# Patient Record
Sex: Male | Born: 1970 | Race: White | Hispanic: No | Smoking: Current every day smoker
Health system: Southern US, Community
[De-identification: ages and names within clinical notes are randomized; demographics above are authoritative.]

## PROBLEM LIST (undated history)

## (undated) DIAGNOSIS — N200 Calculus of kidney: Secondary | ICD-10-CM

## (undated) DIAGNOSIS — M719 Bursopathy, unspecified: Secondary | ICD-10-CM

## (undated) DIAGNOSIS — F419 Anxiety disorder, unspecified: Secondary | ICD-10-CM

## (undated) DIAGNOSIS — M779 Enthesopathy, unspecified: Secondary | ICD-10-CM

## (undated) DIAGNOSIS — M549 Dorsalgia, unspecified: Secondary | ICD-10-CM

## (undated) DIAGNOSIS — IMO0001 Reserved for inherently not codable concepts without codable children: Secondary | ICD-10-CM

## (undated) DIAGNOSIS — M75100 Unspecified rotator cuff tear or rupture of unspecified shoulder, not specified as traumatic: Secondary | ICD-10-CM

## (undated) DIAGNOSIS — F4310 Post-traumatic stress disorder, unspecified: Secondary | ICD-10-CM

## (undated) DIAGNOSIS — G8929 Other chronic pain: Secondary | ICD-10-CM

## (undated) HISTORY — DX: Post-traumatic stress disorder, unspecified: F43.10

## (undated) HISTORY — DX: Unspecified rotator cuff tear or rupture of unspecified shoulder, not specified as traumatic: M75.100

## (undated) HISTORY — PX: KIDNEY STONE SURGERY: SHX686

---

## 2008-02-26 ENCOUNTER — Emergency Department (HOSPITAL_COMMUNITY): Admission: EM | Admit: 2008-02-26 | Discharge: 2008-02-26 | Payer: Self-pay | Admitting: Emergency Medicine

## 2008-06-16 ENCOUNTER — Emergency Department (HOSPITAL_COMMUNITY): Admission: EM | Admit: 2008-06-16 | Discharge: 2008-06-16 | Payer: Self-pay | Admitting: Emergency Medicine

## 2009-04-18 ENCOUNTER — Emergency Department (HOSPITAL_COMMUNITY): Admission: EM | Admit: 2009-04-18 | Discharge: 2009-04-18 | Payer: Self-pay | Admitting: Emergency Medicine

## 2009-05-17 ENCOUNTER — Emergency Department (HOSPITAL_COMMUNITY): Admission: EM | Admit: 2009-05-17 | Discharge: 2009-05-17 | Payer: Self-pay | Admitting: Emergency Medicine

## 2009-08-08 ENCOUNTER — Emergency Department (HOSPITAL_COMMUNITY): Admission: EM | Admit: 2009-08-08 | Discharge: 2009-08-08 | Payer: Self-pay | Admitting: Emergency Medicine

## 2009-10-12 ENCOUNTER — Ambulatory Visit (HOSPITAL_COMMUNITY): Admission: RE | Admit: 2009-10-12 | Discharge: 2009-10-12 | Payer: Self-pay | Admitting: Urology

## 2010-01-16 ENCOUNTER — Inpatient Hospital Stay (HOSPITAL_COMMUNITY): Admission: EM | Admit: 2010-01-16 | Discharge: 2010-01-20 | Payer: Self-pay | Admitting: Emergency Medicine

## 2010-01-28 ENCOUNTER — Ambulatory Visit (HOSPITAL_COMMUNITY): Admission: RE | Admit: 2010-01-28 | Discharge: 2010-01-28 | Payer: Self-pay | Admitting: Urology

## 2010-02-17 ENCOUNTER — Ambulatory Visit (HOSPITAL_COMMUNITY): Admission: RE | Admit: 2010-02-17 | Discharge: 2010-02-17 | Payer: Self-pay | Admitting: Urology

## 2010-02-19 ENCOUNTER — Encounter (HOSPITAL_COMMUNITY): Admission: RE | Admit: 2010-02-19 | Discharge: 2010-03-21 | Payer: Self-pay | Admitting: Orthopaedic Surgery

## 2010-02-24 ENCOUNTER — Ambulatory Visit (HOSPITAL_COMMUNITY): Admission: RE | Admit: 2010-02-24 | Discharge: 2010-02-24 | Payer: Self-pay | Admitting: Urology

## 2010-03-09 ENCOUNTER — Ambulatory Visit (HOSPITAL_COMMUNITY): Admission: RE | Admit: 2010-03-09 | Discharge: 2010-03-09 | Payer: Self-pay | Admitting: Urology

## 2010-05-20 ENCOUNTER — Encounter: Payer: Self-pay | Admitting: Orthopedic Surgery

## 2010-05-20 ENCOUNTER — Emergency Department (HOSPITAL_COMMUNITY): Admission: EM | Admit: 2010-05-20 | Discharge: 2010-05-20 | Payer: Self-pay | Admitting: Emergency Medicine

## 2010-05-23 ENCOUNTER — Inpatient Hospital Stay (HOSPITAL_COMMUNITY): Admission: EM | Admit: 2010-05-23 | Discharge: 2010-05-26 | Payer: Self-pay | Admitting: Emergency Medicine

## 2010-05-23 ENCOUNTER — Ambulatory Visit: Payer: Self-pay | Admitting: Orthopedic Surgery

## 2010-05-25 ENCOUNTER — Encounter: Payer: Self-pay | Admitting: Orthopedic Surgery

## 2010-05-26 ENCOUNTER — Encounter: Payer: Self-pay | Admitting: Orthopedic Surgery

## 2010-06-01 ENCOUNTER — Ambulatory Visit: Payer: Self-pay | Admitting: Orthopedic Surgery

## 2010-06-01 ENCOUNTER — Telehealth: Payer: Self-pay | Admitting: Orthopedic Surgery

## 2010-06-01 DIAGNOSIS — IMO0002 Reserved for concepts with insufficient information to code with codable children: Secondary | ICD-10-CM | POA: Insufficient documentation

## 2010-06-03 ENCOUNTER — Ambulatory Visit (HOSPITAL_COMMUNITY): Admission: RE | Admit: 2010-06-03 | Payer: Self-pay | Admitting: Urology

## 2010-06-14 ENCOUNTER — Telehealth: Payer: Self-pay | Admitting: Orthopedic Surgery

## 2010-06-14 ENCOUNTER — Encounter: Payer: Self-pay | Admitting: Orthopedic Surgery

## 2010-06-15 ENCOUNTER — Encounter: Payer: Self-pay | Admitting: Orthopedic Surgery

## 2010-06-30 ENCOUNTER — Ambulatory Visit: Payer: Self-pay | Admitting: Orthopedic Surgery

## 2010-06-30 ENCOUNTER — Encounter (INDEPENDENT_AMBULATORY_CARE_PROVIDER_SITE_OTHER): Payer: Self-pay | Admitting: *Deleted

## 2010-08-14 ENCOUNTER — Encounter: Payer: Self-pay | Admitting: Urology

## 2010-08-24 NOTE — Miscellaneous (Signed)
Summary: Discharge from [facility]  Discharge from [facility]   Imported By: Jacklynn Ganong 06/14/2010 13:07:32  _____________________________________________________________________  External Attachment:    Type:   Image     Comment:   External Document

## 2010-08-24 NOTE — Progress Notes (Signed)
Summary: Call completed [facility]  ---- Converted from flag ---- ---- 06/01/2010 11:57 AM, Fuller Canada MD wrote: Okey Regal [facility]   Let me speak with the Pharmacist to ok his refills ------------------------------

## 2010-08-24 NOTE — Assessment & Plan Note (Signed)
Summary: HOSP FOL/UP/RE-CK LT THUMB/?XRAY/PER DR HARRISON/[place] MEDICAID[place]   Visit Type:  Follow-up  CC:  cellulitis of left arm.  History of Present Illness: DX: Abscess left thumb.  Treatment: I & D, IV antibiotics, followed by by mouth antibiotics.  MEDS: Clindamycin and hydrocodone   Complaints: He says he is still having pain especially if he gets prearranged.    Today, scheduled for: recheck the wound has closed up for the most part. He still has some swelling and redness around the LEFT thumb. The streaking cellulitis has resolved.  He did loosen this medication when he fell out of his pocket. We will call the  pharmacist to get it refilled.  We will see him again in a month for a final checkup barring any problems     Impression & Recommendations:  Problem # 1:  FELON (ICD-681.01) Assessment Improved  Orders: Post-Op Check (43329)  Patient Instructions: 1)  No further dressing changes needed apply band aids  2)  1 month recheck    Orders Added: 1)  Post-Op Check [51884]

## 2010-08-24 NOTE — Progress Notes (Signed)
Summary: Using 2 diff dr's and 2 diff pharmacies for Narcotics  Phone Note From Pharmacy Call back at [facility] pharmacy   Summary of Call: called stating that patient is getting Hydorcodone 7.5 from another Dr and another pharmacy, last filled 06/11/10 and was trying to get pain med from [place] filled today, advised no Initial call taken by: Ether Griffins,  June 14, 2010 3:16 PM

## 2010-08-24 NOTE — Letter (Signed)
Summary: Hospital prog note  Hospital prog note   Imported By: Cammie Sickle 05/28/2010 12:16:44  _____________________________________________________________________  External Attachment:    Type:   Image     Comment:   External Document

## 2010-08-24 NOTE — Letter (Signed)
Summary: *Orthopedic No Show Letter  [facility]  [address] [place]  [place], [place] [address]   Phone: [phone]  Fax: [phone]    06/30/2010     [place] [address] [place] [address] [place], [place] [address]       Dear Mr. Ambrosini,   Our records indicate that you missed your scheduled appointment with Dr. Beaulah Corin on 06-30-2010. Please contact this office to reschedule your appointment as soon as possible.  It is important that you keep your scheduled appointments with your physician, so we can provide you the best care possible.        Sincerely,   Dr. Terrance Mass, MD [facility] Phone [phone]

## 2010-08-24 NOTE — Miscellaneous (Signed)
Summary: [facility] plan of care  [facility] plan of care   Imported By: Jacklynn Ganong 06/16/2010 09:07:01  _____________________________________________________________________  External Attachment:    Type:   Image     Comment:   External Document

## 2010-08-24 NOTE — Letter (Signed)
Summary: Patient discharge instructions  Patient discharge instructions   Imported By: Jacklynn Ganong 06/02/2010 11:54:46  _____________________________________________________________________  External Attachment:    Type:   Image     Comment:   External Document

## 2010-10-05 LAB — VANCOMYCIN, TROUGH: Vancomycin Tr: 11.8 ug/mL (ref 10.0–20.0)

## 2010-10-06 LAB — DIFFERENTIAL
Lymphocytes Relative: 15 % (ref 12–46)
Lymphs Abs: 2.7 10*3/uL (ref 0.7–4.0)
Monocytes Relative: 5 % (ref 3–12)
Neutro Abs: 14.9 10*3/uL — ABNORMAL HIGH (ref 1.7–7.7)
Neutrophils Relative %: 80 % — ABNORMAL HIGH (ref 43–77)

## 2010-10-06 LAB — WOUND CULTURE

## 2010-10-06 LAB — MRSA PCR SCREENING: MRSA by PCR: NEGATIVE

## 2010-10-06 LAB — CBC
Hemoglobin: 14.1 g/dL (ref 13.0–17.0)
MCH: 28.7 pg (ref 26.0–34.0)
RBC: 4.91 MIL/uL (ref 4.22–5.81)
WBC: 18.6 10*3/uL — ABNORMAL HIGH (ref 4.0–10.5)

## 2010-10-06 LAB — COMPREHENSIVE METABOLIC PANEL
Albumin: 3.4 g/dL — ABNORMAL LOW (ref 3.5–5.2)
BUN: 12 mg/dL (ref 6–23)
Creatinine, Ser: 1.12 mg/dL (ref 0.4–1.5)
Glucose, Bld: 94 mg/dL (ref 70–99)
Total Protein: 6.8 g/dL (ref 6.0–8.3)

## 2010-10-08 LAB — SURGICAL PCR SCREEN: Staphylococcus aureus: POSITIVE — AB

## 2010-10-10 LAB — DIFFERENTIAL
Basophils Absolute: 0 10*3/uL (ref 0.0–0.1)
Basophils Absolute: 0 10*3/uL (ref 0.0–0.1)
Basophils Absolute: 0.1 10*3/uL (ref 0.0–0.1)
Basophils Absolute: 0.1 10*3/uL (ref 0.0–0.1)
Basophils Relative: 0 % (ref 0–1)
Basophils Relative: 1 % (ref 0–1)
Basophils Relative: 1 % (ref 0–1)
Eosinophils Absolute: 0.2 10*3/uL (ref 0.0–0.7)
Eosinophils Absolute: 0.4 10*3/uL (ref 0.0–0.7)
Eosinophils Absolute: 0.5 10*3/uL (ref 0.0–0.7)
Eosinophils Relative: 2 % (ref 0–5)
Eosinophils Relative: 3 % (ref 0–5)
Lymphocytes Relative: 24 % (ref 12–46)
Lymphocytes Relative: 8 % — ABNORMAL LOW (ref 12–46)
Lymphs Abs: 3.2 10*3/uL (ref 0.7–4.0)
Monocytes Absolute: 0.8 10*3/uL (ref 0.1–1.0)
Monocytes Absolute: 0.8 10*3/uL (ref 0.1–1.0)
Monocytes Absolute: 1.1 10*3/uL — ABNORMAL HIGH (ref 0.1–1.0)
Monocytes Absolute: 1.5 10*3/uL — ABNORMAL HIGH (ref 0.1–1.0)
Monocytes Relative: 6 % (ref 3–12)
Monocytes Relative: 7 % (ref 3–12)
Monocytes Relative: 8 % (ref 3–12)
Monocytes Relative: 8 % (ref 3–12)
Neutro Abs: 10 10*3/uL — ABNORMAL HIGH (ref 1.7–7.7)
Neutro Abs: 17.7 10*3/uL — ABNORMAL HIGH (ref 1.7–7.7)
Neutro Abs: 8.6 10*3/uL — ABNORMAL HIGH (ref 1.7–7.7)
Neutrophils Relative %: 57 % (ref 43–77)
Neutrophils Relative %: 65 % (ref 43–77)
Neutrophils Relative %: 70 % (ref 43–77)

## 2010-10-10 LAB — BASIC METABOLIC PANEL
BUN: 20 mg/dL (ref 6–23)
BUN: 21 mg/dL (ref 6–23)
BUN: 23 mg/dL (ref 6–23)
CO2: 26 mEq/L (ref 19–32)
CO2: 26 mEq/L (ref 19–32)
Calcium: 8.5 mg/dL (ref 8.4–10.5)
Calcium: 9 mg/dL (ref 8.4–10.5)
Chloride: 105 mEq/L (ref 96–112)
Chloride: 105 mEq/L (ref 96–112)
Creatinine, Ser: 2.94 mg/dL — ABNORMAL HIGH (ref 0.4–1.5)
GFR calc Af Amer: 42 mL/min — ABNORMAL LOW (ref >60)
GFR calc non Af Amer: 24 mL/min — ABNORMAL LOW (ref >60)
GFR calc non Af Amer: 25 mL/min — ABNORMAL LOW (ref >60)
GFR calc non Af Amer: 28 mL/min — ABNORMAL LOW (ref >60)
Glucose, Bld: 109 mg/dL — ABNORMAL HIGH (ref 70–99)
Glucose, Bld: 93 mg/dL (ref 70–99)
Glucose, Bld: 98 mg/dL (ref 70–99)
Glucose, Bld: 98 mg/dL (ref 70–99)
Potassium: 4 mEq/L (ref 3.5–5.1)
Potassium: 4 mEq/L (ref 3.5–5.1)
Potassium: 4 mEq/L (ref 3.5–5.1)
Sodium: 136 mEq/L (ref 135–145)
Sodium: 137 mEq/L (ref 135–145)
Sodium: 138 mEq/L (ref 135–145)
Sodium: 138 mEq/L (ref 135–145)
Sodium: 139 mEq/L (ref 135–145)

## 2010-10-10 LAB — URINALYSIS, ROUTINE W REFLEX MICROSCOPIC
Bilirubin Urine: NEGATIVE
Nitrite: NEGATIVE
Specific Gravity, Urine: 1.025 (ref 1.005–1.030)
pH: 6 (ref 5.0–8.0)

## 2010-10-10 LAB — CBC
HCT: 36.7 % — ABNORMAL LOW (ref 39.0–52.0)
HCT: 37.1 % — ABNORMAL LOW (ref 39.0–52.0)
HCT: 39.6 % (ref 39.0–52.0)
Hemoglobin: 12.5 g/dL — ABNORMAL LOW (ref 13.0–17.0)
Hemoglobin: 12.5 g/dL — ABNORMAL LOW (ref 13.0–17.0)
Hemoglobin: 12.9 g/dL — ABNORMAL LOW (ref 13.0–17.0)
Hemoglobin: 12.9 g/dL — ABNORMAL LOW (ref 13.0–17.0)
Hemoglobin: 13.8 g/dL (ref 13.0–17.0)
MCH: 28.3 pg (ref 26.0–34.0)
MCH: 28.5 pg (ref 26.0–34.0)
MCHC: 33.7 g/dL (ref 30.0–36.0)
MCHC: 34.4 g/dL (ref 30.0–36.0)
MCHC: 34.5 g/dL (ref 30.0–36.0)
MCV: 83.8 fL (ref 78.0–100.0)
MCV: 84.2 fL (ref 78.0–100.0)
Platelets: 207 10*3/uL (ref 150–400)
RBC: 4.41 MIL/uL (ref 4.22–5.81)
RBC: 4.76 MIL/uL (ref 4.22–5.81)
RDW: 13.6 % (ref 11.5–15.5)
RDW: 14.1 % (ref 11.5–15.5)
RDW: 14.2 % (ref 11.5–15.5)
WBC: 13.1 10*3/uL — ABNORMAL HIGH (ref 4.0–10.5)
WBC: 21 10*3/uL — ABNORMAL HIGH (ref 4.0–10.5)

## 2010-10-10 LAB — URINE CULTURE
Colony Count: NO GROWTH
Culture: NO GROWTH
Special Requests: NEGATIVE

## 2010-10-10 LAB — CULTURE, BLOOD (ROUTINE X 2)
Report Status: 6302011
Report Status: 6302011

## 2010-10-10 LAB — RENAL FUNCTION PANEL
Albumin: 3 g/dL — ABNORMAL LOW (ref 3.5–5.2)
Calcium: 8 mg/dL — ABNORMAL LOW (ref 8.4–10.5)
GFR calc Af Amer: 30 mL/min — ABNORMAL LOW (ref >60)
Phosphorus: 3.5 mg/dL (ref 2.3–4.6)
Potassium: 3.8 mEq/L (ref 3.5–5.1)
Sodium: 138 mEq/L (ref 135–145)

## 2010-10-10 LAB — URINE MICROSCOPIC-ADD ON

## 2010-10-10 LAB — RAPID URINE DRUG SCREEN, HOSP PERFORMED
Barbiturates: NOT DETECTED
Opiates: POSITIVE — AB

## 2010-12-17 ENCOUNTER — Emergency Department (HOSPITAL_COMMUNITY)
Admission: EM | Admit: 2010-12-17 | Discharge: 2010-12-17 | Disposition: A | Discharge status: Home / Self Care | Payer: Medicaid Other | Attending: Emergency Medicine | Admitting: Emergency Medicine

## 2010-12-17 DIAGNOSIS — F319 Bipolar disorder, unspecified: Secondary | ICD-10-CM | POA: Insufficient documentation

## 2010-12-17 DIAGNOSIS — Z79899 Other long term (current) drug therapy: Secondary | ICD-10-CM | POA: Insufficient documentation

## 2010-12-17 DIAGNOSIS — L0211 Cutaneous abscess of neck: Secondary | ICD-10-CM | POA: Insufficient documentation

## 2010-12-17 DIAGNOSIS — L03319 Cellulitis of trunk, unspecified: Secondary | ICD-10-CM | POA: Insufficient documentation

## 2010-12-17 DIAGNOSIS — M129 Arthropathy, unspecified: Secondary | ICD-10-CM | POA: Insufficient documentation

## 2010-12-17 DIAGNOSIS — L02219 Cutaneous abscess of trunk, unspecified: Secondary | ICD-10-CM | POA: Insufficient documentation

## 2011-04-27 ENCOUNTER — Emergency Department (HOSPITAL_COMMUNITY)
Admission: EM | Admit: 2011-04-27 | Discharge: 2011-04-27 | Disposition: A | Discharge status: Home / Self Care | Payer: Medicaid Other | Attending: Emergency Medicine | Admitting: Emergency Medicine

## 2011-04-27 DIAGNOSIS — R112 Nausea with vomiting, unspecified: Secondary | ICD-10-CM | POA: Insufficient documentation

## 2011-04-27 DIAGNOSIS — R109 Unspecified abdominal pain: Secondary | ICD-10-CM | POA: Insufficient documentation

## 2011-04-27 DIAGNOSIS — N200 Calculus of kidney: Secondary | ICD-10-CM | POA: Insufficient documentation

## 2011-04-27 DIAGNOSIS — Z79899 Other long term (current) drug therapy: Secondary | ICD-10-CM | POA: Insufficient documentation

## 2011-04-27 HISTORY — DX: Bursopathy, unspecified: M71.9

## 2011-04-27 HISTORY — DX: Anxiety disorder, unspecified: F41.9

## 2011-04-27 HISTORY — DX: Enthesopathy, unspecified: M77.9

## 2011-04-27 HISTORY — DX: Calculus of kidney: N20.0

## 2011-04-27 HISTORY — DX: Reserved for inherently not codable concepts without codable children: IMO0001

## 2011-04-27 LAB — BASIC METABOLIC PANEL
BUN: 16 mg/dL (ref 6–23)
Chloride: 97 mEq/L (ref 96–112)
Glucose, Bld: 91 mg/dL (ref 70–99)
Potassium: 4 mEq/L (ref 3.5–5.1)

## 2011-04-27 LAB — URINALYSIS, ROUTINE W REFLEX MICROSCOPIC
Bilirubin Urine: NEGATIVE
Specific Gravity, Urine: >1.03 — ABNORMAL HIGH (ref 1.005–1.030)
pH: 5.5 (ref 5.0–8.0)

## 2011-04-27 LAB — URINE MICROSCOPIC-ADD ON

## 2011-04-27 MED ORDER — HYDROMORPHONE HCL 1 MG/ML IJ SOLN
1.0000 mg | Freq: Once | INTRAMUSCULAR | Status: AC
  Administered 2011-04-27: 1 mg via INTRAVENOUS
  Filled 2011-04-27: qty 1

## 2011-04-27 MED ORDER — PROMETHAZINE HCL 25 MG PO TABS: 25.0000 mg | ORAL_TABLET | Freq: Four times a day (QID) | ORAL | Status: AC | PRN

## 2011-04-27 MED ORDER — OXYCODONE-ACETAMINOPHEN 5-325 MG PO TABS
2.0000 | ORAL_TABLET | Freq: Once | ORAL | Status: AC
  Administered 2011-04-27: 2 via ORAL
  Filled 2011-04-27: qty 2

## 2011-04-27 MED ORDER — OXYCODONE-ACETAMINOPHEN 5-325 MG PO TABS: 1.0000 | ORAL_TABLET | ORAL | Status: AC | PRN

## 2011-04-27 MED ORDER — KETOROLAC TROMETHAMINE 30 MG/ML IJ SOLN
30.0000 mg | Freq: Once | INTRAMUSCULAR | Status: AC
Start: 2011-04-27 — End: 2011-04-27
  Administered 2011-04-27: 30 mg via INTRAVENOUS
  Filled 2011-04-27: qty 1

## 2011-04-27 MED ORDER — CIPROFLOXACIN IN D5W 400 MG/200ML IV SOLN
400.0000 mg | Freq: Once | INTRAVENOUS | Status: AC
  Administered 2011-04-27: 400 mg via INTRAVENOUS
  Filled 2011-04-27: qty 200

## 2011-04-27 MED ORDER — CIPROFLOXACIN HCL 500 MG PO TABS: 500.0000 mg | ORAL_TABLET | Freq: Two times a day (BID) | ORAL | Status: AC

## 2011-04-27 MED ORDER — TAMSULOSIN HCL 0.4 MG PO CAPS: 0.4000 mg | ORAL_CAPSULE | Freq: Two times a day (BID) | ORAL | Status: DC

## 2011-04-27 MED ORDER — ONDANSETRON HCL 4 MG/2ML IJ SOLN
4.0000 mg | Freq: Once | INTRAMUSCULAR | Status: AC
  Administered 2011-04-27: 4 mg via INTRAVENOUS
  Filled 2011-04-27: qty 2

## 2011-04-27 MED ORDER — NAPROXEN 500 MG PO TABS: 500.0000 mg | ORAL_TABLET | Freq: Two times a day (BID) | ORAL | Status: AC

## 2011-04-27 NOTE — ED Notes (Signed)
Right flank pain that started tonight

## 2011-04-27 NOTE — ED Provider Notes (Signed)
History     CSN: [id number] Arrival date & time: 04/27/2011  2:48 AM  Chief Complaint  Patient presents with  . Flank Pain    (Consider location/radiation/quality/duration/timing/severity/associated sxs/prior treatment) HPI Comments: Patient is a very healthy 39 -year-old male with a history of kidney stones intermittently in the past who presents with acute onset of right flank pain several hours ago. This is sharp in nature and radiates to his right groin. Symptoms are constant, waxing and waning in intensity and are associated with nausea and vomiting. At this time the symptoms are moderate. He took medication prior to arrival with no improvement in his symptoms. He does note that he has had to have a urinary stent placed in the past  Patient is a 40 y.o. male presenting with flank pain. The history is provided by the patient, medical records and the spouse.  Flank Pain Associated symptoms include abdominal pain. Pertinent negatives include no chest pain, no headaches and no shortness of breath.    Past Medical History  Diagnosis Date  . Kidney stones   . Anxiety   . Blood poisoning   . Bipolar disorder   . Bursitis   . Tendonitis     Past Surgical History  Procedure Date  . Kidney stone surgery     No family history on file.  History  Substance Use Topics  . Smoking status: Current Everyday Smoker    Types: Cigarettes  . Smokeless tobacco: Not on file  . Alcohol Use: No      Review of Systems  Constitutional: Negative for fever and chills.  HENT: Negative for sore throat and neck pain.   Eyes: Negative for visual disturbance.  Respiratory: Negative for cough and shortness of breath.   Cardiovascular: Negative for chest pain.  Gastrointestinal: Positive for nausea, vomiting and abdominal pain. Negative for diarrhea.  Genitourinary: Positive for flank pain. Negative for dysuria and frequency.  Musculoskeletal: Negative for back pain.  Skin: Negative for rash.    Neurological: Negative for weakness, numbness and headaches.  Hematological: Negative for adenopathy.  Psychiatric/Behavioral: Negative for behavioral problems.    Allergies  Review of patient's allergies indicates no known allergies.  Home Medications   Current Outpatient Rx  Name Route Sig Dispense Refill  . ALPRAZOLAM 0.25 MG PO TABS Oral Take 0.25 mg by mouth.      . FLUOXETINE HCL 10 MG PO CAPS Oral Take by mouth daily.      Marland Kitchen HYDROCODONE-ACETAMINOPHEN 7.5-500 MG PO TABS Oral Take 1 tablet by mouth every 4 (four) hours as needed.      Marland Kitchen LAMOTRIGINE 100 MG PO TABS Oral Take 100 mg by mouth daily.      . TRAZODONE HCL 100 MG PO TABS Oral Take 100 mg by mouth at bedtime.        BP 141/102  Pulse 77  Temp(Src) 97.9 F (36.6 C) (Oral)  Resp 22  Ht 5\' 10"  (1.778 m)  Wt 180 lb (81.647 kg)  BMI 25.83 kg/m2  SpO2 100%  Physical Exam  Nursing note and vitals reviewed. Constitutional: He appears well-developed and well-nourished. No distress.  HENT:  Head: Normocephalic and atraumatic.  Mouth/Throat: Oropharynx is clear and moist. No oropharyngeal exudate.  Eyes: Conjunctivae and EOM are normal. Pupils are equal, round, and reactive to light. Right eye exhibits no discharge. Left eye exhibits no discharge. No scleral icterus.  Neck: Normal range of motion. Neck supple. No JVD present. No thyromegaly present.  Cardiovascular: Normal rate,  regular rhythm, normal heart sounds and intact distal pulses.  Exam reveals no gallop and no friction rub.   No murmur heard. Pulmonary/Chest: Effort normal and breath sounds normal. No respiratory distress. He has no wheezes. He has no rales.  Abdominal: Soft. Bowel sounds are normal. He exhibits no distension and no mass. There is no tenderness.       Right-sided CVA tenderness. No abdominal tenderness to palpation  Musculoskeletal: Normal range of motion. He exhibits no edema and no tenderness.  Lymphadenopathy:    He has no cervical  adenopathy.  Neurological: He is alert. Coordination normal.  Skin: Skin is warm and dry. No rash noted. No erythema.  Psychiatric: He has a normal mood and affect. His behavior is normal.    ED Course  Procedures (including critical care time)   Labs Reviewed  URINALYSIS, ROUTINE W REFLEX MICROSCOPIC  BASIC METABOLIC PANEL   No results found.   No diagnosis found.    MDM  Exam consistent with a kidney stone. At this time patient declined CT scan and appropriately requests medication including anti-inflammatories. IV medications ordered urinalysis and basic metabolic panel to evaluate for kidney function to    Patient has had significant improvement in his symptoms, has a urinalysis showing large hemoglobin, negative nitrate, negative leukocytes, many bacteria. There are no white blood cells present. There is calcium oxalate crystals present. I believe that the majority of his symptoms are related to a kidney stone about a urinary infection however due to the many bacteria I will treat with ciprofloxacin prior to discharge. Again the patient has declined CT at this time I so comfortable with the patient following up with his outpatient urologist. Maryclare Labrador start on Flomax, pain medications, nausea medicine and followup.  Renal function reviewed and is normal.  Results for orders placed during the hospital encounter of 04/27/11  URINALYSIS, ROUTINE W REFLEX MICROSCOPIC      Component Value Range   Color, Urine YELLOW  YELLOW    Appearance CLOUDY (*) CLEAR    Specific Gravity, Urine >1.030 (*) 1.005 - 1.030    pH 5.5  5.0 - 8.0    Glucose, UA NEGATIVE  NEGATIVE (mg/dL)   Hgb urine dipstick LARGE (*) NEGATIVE    Bilirubin Urine NEGATIVE  NEGATIVE    Ketones, ur NEGATIVE  NEGATIVE (mg/dL)   Protein, ur NEGATIVE  NEGATIVE (mg/dL)   Urobilinogen, UA 0.2  0.0 - 1.0 (mg/dL)   Nitrite NEGATIVE  NEGATIVE    Leukocytes, UA NEGATIVE  NEGATIVE   BASIC METABOLIC PANEL      Component Value  Range   Sodium 135  135 - 145 (mEq/L)   Potassium 4.0  3.5 - 5.1 (mEq/L)   Chloride 97  96 - 112 (mEq/L)   CO2 23  19 - 32 (mEq/L)   Glucose, Bld 91  70 - 99 (mg/dL)   BUN 16  6 - 23 (mg/dL)   Creatinine, Ser 6.96  0.50 - 1.35 (mg/dL)   Calcium 9.1  8.4 - 29.5 (mg/dL)   GFR calc non Af Amer 79 (*) >90 (mL/min)   GFR calc Af Amer >90  >90 (mL/min)  URINE MICROSCOPIC-ADD ON      Component Value Range   WBC, UA 0-2  <3 (WBC/hpf)   RBC / HPF TOO NUMEROUS TO COUNT  <3 (RBC/hpf)   Bacteria, UA MANY (*) RARE    Crystals CA OXALATE CRYSTALS (*) NEGATIVE    No results found.    Arlys John  Cody Amass, MD 04/27/11 [phone]

## 2011-04-29 ENCOUNTER — Other Ambulatory Visit (HOSPITAL_COMMUNITY): Payer: Self-pay | Admitting: Urology

## 2011-04-29 DIAGNOSIS — R109 Unspecified abdominal pain: Secondary | ICD-10-CM

## 2011-05-02 ENCOUNTER — Ambulatory Visit (HOSPITAL_COMMUNITY)
Admission: RE | Admit: 2011-05-02 | Discharge: 2011-05-02 | Disposition: A | Discharge status: Home / Self Care | Payer: Medicaid Other | Source: Ambulatory Visit | Attending: Urology | Admitting: Urology

## 2011-05-02 DIAGNOSIS — R3129 Other microscopic hematuria: Secondary | ICD-10-CM | POA: Insufficient documentation

## 2011-05-02 DIAGNOSIS — J984 Other disorders of lung: Secondary | ICD-10-CM | POA: Insufficient documentation

## 2011-05-02 DIAGNOSIS — R1031 Right lower quadrant pain: Secondary | ICD-10-CM | POA: Insufficient documentation

## 2011-05-02 DIAGNOSIS — R109 Unspecified abdominal pain: Secondary | ICD-10-CM

## 2011-05-02 DIAGNOSIS — N200 Calculus of kidney: Secondary | ICD-10-CM | POA: Insufficient documentation

## 2011-06-13 ENCOUNTER — Ambulatory Visit (HOSPITAL_COMMUNITY)
Admission: RE | Admit: 2011-06-13 | Discharge: 2011-06-13 | Disposition: A | Discharge status: Home / Self Care | Payer: Medicaid Other | Source: Ambulatory Visit | Attending: Urology | Admitting: Urology

## 2011-06-13 ENCOUNTER — Other Ambulatory Visit (HOSPITAL_COMMUNITY): Payer: Self-pay | Admitting: Urology

## 2011-06-13 DIAGNOSIS — N200 Calculus of kidney: Secondary | ICD-10-CM

## 2011-06-13 DIAGNOSIS — R109 Unspecified abdominal pain: Secondary | ICD-10-CM | POA: Insufficient documentation

## 2012-12-23 ENCOUNTER — Encounter (HOSPITAL_COMMUNITY): Payer: Self-pay | Admitting: Emergency Medicine

## 2012-12-23 ENCOUNTER — Emergency Department (HOSPITAL_COMMUNITY)
Admission: EM | Admit: 2012-12-23 | Discharge: 2012-12-23 | Disposition: A | Discharge status: Home / Self Care | Payer: Medicaid Other | Attending: Emergency Medicine | Admitting: Emergency Medicine

## 2012-12-23 DIAGNOSIS — M549 Dorsalgia, unspecified: Secondary | ICD-10-CM

## 2012-12-23 DIAGNOSIS — F319 Bipolar disorder, unspecified: Secondary | ICD-10-CM | POA: Insufficient documentation

## 2012-12-23 DIAGNOSIS — Z79899 Other long term (current) drug therapy: Secondary | ICD-10-CM | POA: Insufficient documentation

## 2012-12-23 DIAGNOSIS — Z8739 Personal history of other diseases of the musculoskeletal system and connective tissue: Secondary | ICD-10-CM | POA: Insufficient documentation

## 2012-12-23 DIAGNOSIS — Z87442 Personal history of urinary calculi: Secondary | ICD-10-CM | POA: Insufficient documentation

## 2012-12-23 DIAGNOSIS — F17200 Nicotine dependence, unspecified, uncomplicated: Secondary | ICD-10-CM | POA: Insufficient documentation

## 2012-12-23 DIAGNOSIS — M2550 Pain in unspecified joint: Secondary | ICD-10-CM | POA: Insufficient documentation

## 2012-12-23 DIAGNOSIS — F411 Generalized anxiety disorder: Secondary | ICD-10-CM | POA: Insufficient documentation

## 2012-12-23 DIAGNOSIS — G8929 Other chronic pain: Secondary | ICD-10-CM | POA: Insufficient documentation

## 2012-12-23 DIAGNOSIS — M545 Low back pain, unspecified: Secondary | ICD-10-CM | POA: Insufficient documentation

## 2012-12-23 HISTORY — DX: Dorsalgia, unspecified: M54.9

## 2012-12-23 HISTORY — DX: Other chronic pain: G89.29

## 2012-12-23 MED ORDER — ACETAMINOPHEN-CODEINE #3 300-30 MG PO TABS: ORAL_TABLET | ORAL | Status: DC

## 2012-12-23 MED ORDER — DICLOFENAC SODIUM 75 MG PO TBEC: 75.0000 mg | DELAYED_RELEASE_TABLET | Freq: Two times a day (BID) | ORAL | Status: DC

## 2012-12-23 NOTE — ED Notes (Signed)
States that he is having more pain in his lower back with spasms down his left leg.  States he does have a history of lower back problems chronically.

## 2012-12-23 NOTE — ED Provider Notes (Signed)
History     CSN: [id number]  Arrival date & time 12/23/12  1446   First MD Initiated Contact with Patient 12/23/12 1557      Chief Complaint  Patient presents with  . Back Pain    (Consider location/radiation/quality/duration/timing/severity/associated sxs/prior treatment) Patient is a 42 y.o. male presenting with back pain. The history is provided by the patient.  Back Pain Location:  Lumbar spine Quality:  Cramping and aching Radiates to:  L thigh Pain severity:  Severe Pain is:  Same all the time Onset quality:  Gradual Duration:  4 weeks Timing:  Intermittent Progression:  Worsening Chronicity:  Chronic Context comment:  Chronic back pain Relieved by:  Nothing Ineffective treatments:  Cold packs and heating pad Associated symptoms: no abdominal pain, no bladder incontinence, no bowel incontinence, no chest pain, no dysuria and no perianal numbness   Risk factors: no recent surgery     Past Medical History  Diagnosis Date  . Kidney stones   . Anxiety   . Blood poisoning   . Bipolar disorder   . Bursitis   . Tendonitis   . Chronic back pain     Past Surgical History  Procedure Laterality Date  . Kidney stone surgery      No family history on file.  History  Substance Use Topics  . Smoking status: Current Every Day Smoker    Types: Cigarettes  . Smokeless tobacco: Not on file  . Alcohol Use: No      Review of Systems  Constitutional: Negative for activity change.       All ROS Neg except as noted in HPI  HENT: Negative for nosebleeds and neck pain.   Eyes: Negative for photophobia and discharge.  Respiratory: Negative for cough, shortness of breath and wheezing.   Cardiovascular: Negative for chest pain and palpitations.  Gastrointestinal: Negative for abdominal pain, blood in stool and bowel incontinence.  Genitourinary: Negative for bladder incontinence, dysuria, frequency and hematuria.  Musculoskeletal: Positive for back pain and arthralgias.   Skin: Negative.   Neurological: Negative for dizziness, seizures and speech difficulty.  Psychiatric/Behavioral: Negative for hallucinations and confusion. The patient is nervous/anxious.     Allergies  Review of patient's allergies indicates no known allergies.  Home Medications   Current Outpatient Rx  Name  Route  Sig  Dispense  Refill  . ALPRAZolam (XANAX) 1 MG tablet   Oral   Take 1 mg by mouth 4 (four) times daily as needed for sleep or anxiety.         Marland Kitchen FLUoxetine (PROZAC) 10 MG capsule   Oral   Take 10 mg by mouth daily.          Marland Kitchen ibuprofen (ADVIL,MOTRIN) 200 MG tablet   Oral   Take 200 mg by mouth every 6 (six) hours as needed for pain.         Marland Kitchen lamoTRIgine (LAMICTAL) 100 MG tablet   Oral   Take 100 mg by mouth 2 (two) times daily.            BP 134/91  Pulse 90  Temp(Src) 98.2 F (36.8 C) (Oral)  Resp 16  Ht 5\' 10"  (1.778 m)  Wt 180 lb (81.647 kg)  BMI 25.83 kg/m2  SpO2 100%  Physical Exam  Nursing note and vitals reviewed. Constitutional: He is oriented to person, place, and time. He appears well-developed and well-nourished.  Non-toxic appearance.  HENT:  Head: Normocephalic.  Right Ear: Tympanic membrane and external ear  normal.  Left Ear: Tympanic membrane and external ear normal.  Eyes: EOM and lids are normal. Pupils are equal, round, and reactive to light.  Neck: Normal range of motion. Neck supple. Carotid bruit is not present.  Cardiovascular: Normal rate, regular rhythm, normal heart sounds, intact distal pulses and normal pulses.   Pulmonary/Chest: Breath sounds normal. No respiratory distress.  Abdominal: Soft. Bowel sounds are normal. There is no tenderness. There is no guarding.  Musculoskeletal: Normal range of motion.  Lower lumbar area pain. No hot areas. No step off.  Lymphadenopathy:       Head (right side): No submandibular adenopathy present.       Head (left side): No submandibular adenopathy present.    He has no  cervical adenopathy.  Neurological: He is alert and oriented to person, place, and time. He has normal strength. No cranial nerve deficit or sensory deficit. He exhibits normal muscle tone. Coordination normal.  Skin: Skin is warm and dry.  Psychiatric: He has a normal mood and affect. His speech is normal.    ED Course  Procedures (including critical care time)  Labs Reviewed - No data to display No results found.   No diagnosis found.    MDM  I have reviewed nursing notes, vital signs, and all appropriate lab and imaging results for this patient. Pt has long term hx of back pain. Pain has been lasting longer for the past 4 days, and extends t the left lower extremity.. No injury or trauma. No loss of bowel or bladder function. No gross neuro function on exam. Plan: diclofenac and norco for pain. Pt to follow up with primary MD or orthopedics for evaluation and management.       Kathie Dike, PA-C 12/23/12 1610

## 2012-12-23 NOTE — ED Notes (Signed)
Pt reports has had lower back pain for "years."  Pt c/o lower back pain radiating down left leg x 4 weeks.  Denies injury.  Denies any incontinence of bowel or bladder.

## 2012-12-24 NOTE — ED Provider Notes (Signed)
Medical screening examination/treatment/procedure(s) were performed by non-physician practitioner and as supervising physician I was immediately available for consultation/collaboration. Devoria Albe, MD, [facility]   Ward Givens, MD 12/24/12 [phone]

## 2013-01-05 ENCOUNTER — Encounter (HOSPITAL_COMMUNITY): Payer: Self-pay

## 2013-01-05 ENCOUNTER — Emergency Department (HOSPITAL_COMMUNITY)
Admission: EM | Admit: 2013-01-05 | Discharge: 2013-01-05 | Disposition: A | Discharge status: Home / Self Care | Payer: Medicaid Other | Attending: Emergency Medicine | Admitting: Emergency Medicine

## 2013-01-05 DIAGNOSIS — M545 Low back pain, unspecified: Secondary | ICD-10-CM

## 2013-01-05 DIAGNOSIS — F17200 Nicotine dependence, unspecified, uncomplicated: Secondary | ICD-10-CM | POA: Insufficient documentation

## 2013-01-05 DIAGNOSIS — J029 Acute pharyngitis, unspecified: Secondary | ICD-10-CM

## 2013-01-05 DIAGNOSIS — Z87442 Personal history of urinary calculi: Secondary | ICD-10-CM | POA: Insufficient documentation

## 2013-01-05 DIAGNOSIS — F319 Bipolar disorder, unspecified: Secondary | ICD-10-CM | POA: Insufficient documentation

## 2013-01-05 DIAGNOSIS — R1310 Dysphagia, unspecified: Secondary | ICD-10-CM | POA: Insufficient documentation

## 2013-01-05 DIAGNOSIS — Z8739 Personal history of other diseases of the musculoskeletal system and connective tissue: Secondary | ICD-10-CM | POA: Insufficient documentation

## 2013-01-05 DIAGNOSIS — F411 Generalized anxiety disorder: Secondary | ICD-10-CM | POA: Insufficient documentation

## 2013-01-05 DIAGNOSIS — Z8619 Personal history of other infectious and parasitic diseases: Secondary | ICD-10-CM | POA: Insufficient documentation

## 2013-01-05 DIAGNOSIS — R52 Pain, unspecified: Secondary | ICD-10-CM | POA: Insufficient documentation

## 2013-01-05 DIAGNOSIS — G8929 Other chronic pain: Secondary | ICD-10-CM | POA: Insufficient documentation

## 2013-01-05 DIAGNOSIS — K1379 Other lesions of oral mucosa: Secondary | ICD-10-CM

## 2013-01-05 DIAGNOSIS — Z79899 Other long term (current) drug therapy: Secondary | ICD-10-CM | POA: Insufficient documentation

## 2013-01-05 LAB — RAPID STREP SCREEN (MED CTR MEBANE ONLY): Streptococcus, Group A Screen (Direct): NEGATIVE

## 2013-01-05 MED ORDER — ONDANSETRON HCL 4 MG/2ML IJ SOLN
4.0000 mg | Freq: Once | INTRAMUSCULAR | Status: AC
  Administered 2013-01-05: 4 mg via INTRAVENOUS
  Filled 2013-01-05 (×2): qty 2

## 2013-01-05 MED ORDER — DEXAMETHASONE SODIUM PHOSPHATE 4 MG/ML IJ SOLN
10.0000 mg | Freq: Once | INTRAMUSCULAR | Status: AC
  Administered 2013-01-05: 10 mg via INTRAVENOUS
  Filled 2013-01-05: qty 3

## 2013-01-05 MED ORDER — DIPHENHYDRAMINE HCL 50 MG/ML IJ SOLN
25.0000 mg | Freq: Once | INTRAMUSCULAR | Status: AC
  Administered 2013-01-05: 25 mg via INTRAVENOUS
  Filled 2013-01-05: qty 1

## 2013-01-05 MED ORDER — OXYCODONE-ACETAMINOPHEN 5-325 MG PO TABS
1.0000 | ORAL_TABLET | Freq: Once | ORAL | Status: AC
  Administered 2013-01-05: 1 via ORAL
  Filled 2013-01-05: qty 1

## 2013-01-05 MED ORDER — PREDNISONE 50 MG PO TABS: ORAL_TABLET | ORAL | Status: DC

## 2013-01-05 MED ORDER — DIPHENHYDRAMINE HCL 25 MG PO TABS: 50.0000 mg | ORAL_TABLET | ORAL | Status: DC | PRN

## 2013-01-05 MED ORDER — FAMOTIDINE 20 MG PO TABS: 20.0000 mg | ORAL_TABLET | Freq: Two times a day (BID) | ORAL | Status: DC

## 2013-01-05 MED ORDER — HYDROMORPHONE HCL PF 1 MG/ML IJ SOLN
1.0000 mg | Freq: Once | INTRAMUSCULAR | Status: AC
  Administered 2013-01-05: 1 mg via INTRAVENOUS
  Filled 2013-01-05: qty 1

## 2013-01-05 MED ORDER — FAMOTIDINE IN NACL 20-0.9 MG/50ML-% IV SOLN
20.0000 mg | Freq: Once | INTRAVENOUS | Status: AC
  Administered 2013-01-05: 20 mg via INTRAVENOUS
  Filled 2013-01-05: qty 50

## 2013-01-05 MED ORDER — SODIUM CHLORIDE 0.9 % IV SOLN
1000.0000 mL | INTRAVENOUS | Status: DC
  Administered 2013-01-05: 1000 mL via INTRAVENOUS

## 2013-01-05 MED ORDER — SODIUM CHLORIDE 0.9 % IV SOLN
1000.0000 mL | Freq: Once | INTRAVENOUS | Status: AC
  Administered 2013-01-05: 1000 mL via INTRAVENOUS

## 2013-01-05 NOTE — ED Provider Notes (Signed)
Pt stable, no worsening, I feel he is safe for d/c home Discussed strict return precautions   Joya Gaskins, MD 01/05/13 [phone]

## 2013-01-05 NOTE — ED Notes (Signed)
Sore throat onset this am, states also has continued lower back pain for some time

## 2013-01-05 NOTE — ED Provider Notes (Signed)
I assumed care in signout to monitor patient's uvula edema He is awake/alert, no distress, no stridor, no tongue/lip swelling but does have uvula edema He is handling secretions well Will continue to monitor  Joya Gaskins, MD 01/05/13 0840

## 2013-01-05 NOTE — ED Provider Notes (Signed)
History     CSN: [id number]  Arrival date & time 01/05/13  0540   First MD Initiated Contact with Patient 01/05/13 [phone]      Chief Complaint  Patient presents with  . Back Pain  . Sore Throat    (Consider location/radiation/quality/duration/timing/severity/associated sxs/prior treatment) Patient is a 42 y.o. male presenting with back pain and pharyngitis. The history is provided by the patient.  Back Pain Sore Throat  He woke this morning with a sore throat and a feeling like his uvula was swollen. He states that it happened once before and he was told it was some kind of allergy. He has no difficulty with swallowing it is not having any difficulty breathing. He denies fever, chills, sweats. Nothing makes it better nothing makes it worse. He also has chronic back pain which flares up periodically and he states that his back pain has been worse over the last 2 weeks. He had been given a prescription for Tylenol with codeine but states that her has not helped and he has not taken anything else for his pain. He has been applying ice packs. He denies any weakness, numbness, tingling. He denies any bowel or bladder dysfunction. He denies any precipitating trauma.  Past Medical History  Diagnosis Date  . Kidney stones   . Anxiety   . Blood poisoning   . Bipolar disorder   . Bursitis   . Tendonitis   . Chronic back pain     Past Surgical History  Procedure Laterality Date  . Kidney stone surgery      Family History  Problem Relation Age of Onset  . Heart failure Other     History  Substance Use Topics  . Smoking status: Current Every Day Smoker -- 1.50 packs/day for 28 years    Types: Cigarettes  . Smokeless tobacco: Never Used  . Alcohol Use: No     Comment: occasional      Review of Systems  Musculoskeletal: Positive for back pain.  All other systems reviewed and are negative.    Allergies  Review of patient's allergies indicates no known allergies.  Home  Medications   Current Outpatient Rx  Name  Route  Sig  Dispense  Refill  . ALPRAZolam (XANAX) 1 MG tablet   Oral   Take 1 mg by mouth 4 (four) times daily as needed for sleep or anxiety.         Marland Kitchen FLUoxetine (PROZAC) 10 MG capsule   Oral   Take 10 mg by mouth daily.          Marland Kitchen ibuprofen (ADVIL,MOTRIN) 200 MG tablet   Oral   Take 200 mg by mouth every 6 (six) hours as needed for pain.         Marland Kitchen lamoTRIgine (LAMICTAL) 100 MG tablet   Oral   Take 100 mg by mouth 2 (two) times daily.          Marland Kitchen acetaminophen-codeine (TYLENOL #3) 300-30 MG per tablet      1 or 2 po q4h prn pain   20 tablet   0   . diclofenac (VOLTAREN) 75 MG EC tablet   Oral   Take 1 tablet (75 mg total) by mouth 2 (two) times daily.   12 tablet   0     Please take this medication with food     BP 142/104  Pulse 92  Temp(Src) 98.1 F (36.7 C) (Oral)  Resp 20  Ht 5\' 10"  (1.778 m)  Wt 180 lb (81.647 kg)  BMI 25.83 kg/m2  SpO2 99%  Physical Exam  Nursing note and vitals reviewed.  42 year old male, resting comfortably and in no acute distress. Vital signs are significant for hypertension with blood pressure 142/104. Oxygen saturation is 99%, which is normal. Head is normocephalic and atraumatic. PERRLA, EOMI. Oropharynx shows erythema and hypertrophy of tonsils and edema of the uvula. No exudate is seen. He's not having any difficulty with secretions, there is no stridor, and phonation is normal. Neck is nontender and supple without adenopathy or JVD. Back is nontender and there is no CVA tenderness. Straight leg raise is positive on the left at 30 and on the right at 45. Lungs are clear without rales, wheezes, or rhonchi. Chest is nontender. Heart has regular rate and rhythm without murmur. Abdomen is soft, flat, nontender without masses or hepatosplenomegaly and peristalsis is normoactive. Extremities have no cyanosis or edema, full range of motion is present. Skin is warm and dry without  rash. Neurologic: Mental status is normal, cranial nerves are intact, there are no motor or sensory deficits.  ED Course  Procedures (including critical care time)  Results for orders placed during the hospital encounter of 01/05/13  RAPID STREP SCREEN      Result Value Range   Streptococcus, Group A Screen (Direct) NEGATIVE  NEGATIVE    1. Pharyngitis   2. Uvular edema   3. Acute exacerbation of chronic low back pain       MDM  Pharyngitis with swelling of the uvula. This could be infectious or allergic in origin. It does not have appearance of angioedema. Strep screen will be obtained and he'll be given a dose of dexamethasone. He is given a dose of hydromorphone for his back pain. Old records are reviewed and he actually was seen for low back pain 2 weeks ago and given prescriptions for diclofenac and Tylenol #3.  Strep screen has come back negative. At this point, he states he is not feeling any better but neither is he feeling any worse. He will be observed in the ED. Case is signed out to Dr. Bebe Shaggy who will recheck him in in 1-2 hours to make sure the symptoms are not progressing.      Dione Booze, MD 01/05/13 [phone]

## 2013-01-07 LAB — CULTURE, GROUP A STREP

## 2013-01-27 ENCOUNTER — Encounter (HOSPITAL_COMMUNITY): Payer: Self-pay | Admitting: Emergency Medicine

## 2013-01-27 ENCOUNTER — Emergency Department (HOSPITAL_COMMUNITY)
Admission: EM | Admit: 2013-01-27 | Discharge: 2013-01-27 | Disposition: A | Discharge status: Home / Self Care | Payer: Medicaid Other | Attending: Emergency Medicine | Admitting: Emergency Medicine

## 2013-01-27 DIAGNOSIS — Z87828 Personal history of other (healed) physical injury and trauma: Secondary | ICD-10-CM | POA: Insufficient documentation

## 2013-01-27 DIAGNOSIS — G8929 Other chronic pain: Secondary | ICD-10-CM | POA: Insufficient documentation

## 2013-01-27 DIAGNOSIS — Z791 Long term (current) use of non-steroidal anti-inflammatories (NSAID): Secondary | ICD-10-CM | POA: Insufficient documentation

## 2013-01-27 DIAGNOSIS — R52 Pain, unspecified: Secondary | ICD-10-CM | POA: Insufficient documentation

## 2013-01-27 DIAGNOSIS — F17200 Nicotine dependence, unspecified, uncomplicated: Secondary | ICD-10-CM | POA: Insufficient documentation

## 2013-01-27 DIAGNOSIS — IMO0002 Reserved for concepts with insufficient information to code with codable children: Secondary | ICD-10-CM | POA: Insufficient documentation

## 2013-01-27 DIAGNOSIS — Z8739 Personal history of other diseases of the musculoskeletal system and connective tissue: Secondary | ICD-10-CM | POA: Insufficient documentation

## 2013-01-27 DIAGNOSIS — Z79899 Other long term (current) drug therapy: Secondary | ICD-10-CM | POA: Insufficient documentation

## 2013-01-27 DIAGNOSIS — M5416 Radiculopathy, lumbar region: Secondary | ICD-10-CM

## 2013-01-27 DIAGNOSIS — Z87442 Personal history of urinary calculi: Secondary | ICD-10-CM | POA: Insufficient documentation

## 2013-01-27 DIAGNOSIS — F411 Generalized anxiety disorder: Secondary | ICD-10-CM | POA: Insufficient documentation

## 2013-01-27 DIAGNOSIS — Z8619 Personal history of other infectious and parasitic diseases: Secondary | ICD-10-CM | POA: Insufficient documentation

## 2013-01-27 DIAGNOSIS — F319 Bipolar disorder, unspecified: Secondary | ICD-10-CM | POA: Insufficient documentation

## 2013-01-27 MED ORDER — HYDROCODONE-ACETAMINOPHEN 5-325 MG PO TABS: 1.0000 | ORAL_TABLET | ORAL | Status: DC | PRN

## 2013-01-27 MED ORDER — METHOCARBAMOL 500 MG PO TABS: 1000.0000 mg | ORAL_TABLET | Freq: Four times a day (QID) | ORAL | Status: AC

## 2013-01-27 NOTE — ED Notes (Signed)
Pt c/o chronic lower back pain. C/o worsening to Left lower back x 3 weeks. Nad. Denies injury.

## 2013-01-27 NOTE — ED Provider Notes (Signed)
Medical screening examination/treatment/procedure(s) were performed by non-physician practitioner and as supervising physician I was immediately available for consultation/collaboration.  Donnetta Hutching, MD 01/27/13 [phone]

## 2013-01-27 NOTE — ED Provider Notes (Signed)
History    CSN: [id number] Arrival date & time 01/27/13  1308  First MD Initiated Contact with Patient 01/27/13 0940     Chief Complaint  Patient presents with  . Back Pain   (Consider location/radiation/quality/duration/timing/severity/associated sxs/prior Treatment) HPI Comments: Cody Walls is a 42 y.o. Male presenting with acute on chronic low back pain which has been present for the worsened for the past 3 weeks.   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  There is radiation into the left lower extremity which is not new or different from prior pattern of pain.  There has been no weakness or numbness in the lower extremities and no urinary or bowel retention or incontinence.  Patient does not have a history of cancer or IVDU.  He is scheduled to see his pcp in 2 days in anticipation of referral back to his orthopedist for further management of his back pain, now that his medicaid has been reinstated.      The history is provided by the patient.   Past Medical History  Diagnosis Date  . Kidney stones   . Anxiety   . Blood poisoning   . Bipolar disorder   . Bursitis   . Tendonitis   . Chronic back pain    Past Surgical History  Procedure Laterality Date  . Kidney stone surgery     Family History  Problem Relation Age of Onset  . Heart failure Other    History  Substance Use Topics  . Smoking status: Current Every Day Smoker -- 1.50 packs/day for 28 years    Types: Cigarettes  . Smokeless tobacco: Never Used  . Alcohol Use: No     Comment: occasional    Review of Systems  Constitutional: Negative for fever.  Respiratory: Negative for shortness of breath.   Cardiovascular: Negative for chest pain and leg swelling.  Gastrointestinal: Negative for abdominal pain, constipation and abdominal distention.  Genitourinary: Negative for dysuria, urgency, frequency, flank pain and  difficulty urinating.  Musculoskeletal: Positive for back pain. Negative for joint swelling and gait problem.  Skin: Negative for rash.  Neurological: Negative for weakness and numbness.    Allergies  Review of patient's allergies indicates no known allergies.  Home Medications   Current Outpatient Rx  Name  Route  Sig  Dispense  Refill  . ALPRAZolam (XANAX) 1 MG tablet   Oral   Take 1 mg by mouth 4 (four) times daily as needed for sleep or anxiety.         . calcium carbonate (TUMS - DOSED IN MG ELEMENTAL CALCIUM) 500 MG chewable tablet   Oral   Chew 2 tablets by mouth daily as needed for heartburn.         . diclofenac (VOLTAREN) 75 MG EC tablet   Oral   Take 1 tablet (75 mg total) by mouth 2 (two) times daily.   12 tablet   0     Please take this medication with food   . FLUoxetine (PROZAC) 10 MG capsule   Oral   Take 10 mg by mouth daily.          Marland Kitchen ibuprofen (ADVIL,MOTRIN) 200 MG tablet   Oral   Take 200 mg by mouth every 6 (six) hours as needed for pain.         Marland Kitchen lamoTRIgine (LAMICTAL) 100 MG tablet   Oral   Take 100 mg  by mouth 2 (two) times daily.          Marland Kitchen HYDROcodone-acetaminophen (NORCO/VICODIN) 5-325 MG per tablet   Oral   Take 1 tablet by mouth every 4 (four) hours as needed for pain.   15 tablet   0   . methocarbamol (ROBAXIN) 500 MG tablet   Oral   Take 2 tablets (1,000 mg total) by mouth 4 (four) times daily.   40 tablet   0    BP 133/91  Pulse 90  Temp(Src) 98.9 F (37.2 C) (Oral)  Resp 18  SpO2 99% Physical Exam  Nursing note and vitals reviewed. Constitutional: He appears well-developed and well-nourished.  HENT:  Head: Normocephalic.  Eyes: Conjunctivae are normal.  Neck: Normal range of motion. Neck supple.  Cardiovascular: Normal rate and intact distal pulses.   Pedal pulses normal.  Pulmonary/Chest: Effort normal.  Abdominal: Soft. Bowel sounds are normal. He exhibits no distension and no mass.  Musculoskeletal:  Normal range of motion. He exhibits no edema.       Lumbar back: He exhibits tenderness. He exhibits no swelling, no edema and no spasm.  ttp midline and bilateral low back pain.  Left SI joint ttp.  Neurological: He is alert. He has normal strength. He displays no atrophy and no tremor. No sensory deficit. Gait normal.  Reflex Scores:      Patellar reflexes are 2+ on the right side and 2+ on the left side.      Achilles reflexes are 2+ on the right side and 2+ on the left side. No strength deficit noted in hip and knee flexor and extensor muscle groups.  Ankle flexion and extension intact.  Skin: Skin is warm and dry.  Psychiatric: He has a normal mood and affect.    ED Course  Procedures (including critical care time) Labs Reviewed - No data to display No results found. 1. Chronic radicular pain of lower back     MDM  No neuro deficit on exam or by history to suggest emergent or surgical presentation.  Also discussed worsened sx that should prompt immediate re-evaluation including distal weakness, bowel/bladder retention/incontinence.  Pt was prescribed hydrocodone and robaxin.  Encouraged heat,  Rest,  Avoid activity that worsens pain.  F/u with pcp in 2 days as scheduled.      Burgess Amor, PA-C 01/27/13 1020

## 2013-04-02 ENCOUNTER — Other Ambulatory Visit (HOSPITAL_COMMUNITY): Payer: Self-pay | Admitting: Orthopaedic Surgery

## 2013-04-02 DIAGNOSIS — M25511 Pain in right shoulder: Secondary | ICD-10-CM

## 2013-04-09 ENCOUNTER — Ambulatory Visit (HOSPITAL_COMMUNITY)
Admission: RE | Admit: 2013-04-09 | Discharge: 2013-04-09 | Disposition: A | Discharge status: Home / Self Care | Payer: Medicaid Other | Source: Ambulatory Visit | Attending: Orthopaedic Surgery | Admitting: Orthopaedic Surgery

## 2013-04-09 DIAGNOSIS — M25519 Pain in unspecified shoulder: Secondary | ICD-10-CM | POA: Insufficient documentation

## 2013-04-09 DIAGNOSIS — M25511 Pain in right shoulder: Secondary | ICD-10-CM

## 2013-04-09 DIAGNOSIS — M19019 Primary osteoarthritis, unspecified shoulder: Secondary | ICD-10-CM | POA: Insufficient documentation

## 2013-08-16 ENCOUNTER — Encounter (HOSPITAL_COMMUNITY): Payer: Self-pay | Admitting: Psychiatry

## 2013-08-16 ENCOUNTER — Ambulatory Visit (INDEPENDENT_AMBULATORY_CARE_PROVIDER_SITE_OTHER): Payer: 59 | Admitting: Psychiatry

## 2013-08-16 VITALS — BP 150/100 | Ht 70.0 in | Wt 200.0 lb

## 2013-08-16 DIAGNOSIS — F4310 Post-traumatic stress disorder, unspecified: Secondary | ICD-10-CM | POA: Insufficient documentation

## 2013-08-16 MED ORDER — LAMOTRIGINE 100 MG PO TABS: 100.0000 mg | ORAL_TABLET | Freq: Two times a day (BID) | ORAL | Status: DC

## 2013-08-16 MED ORDER — FLUOXETINE HCL 10 MG PO CAPS: 10.0000 mg | ORAL_CAPSULE | Freq: Every day | ORAL | Status: DC

## 2013-08-16 MED ORDER — ALPRAZOLAM 1 MG PO TABS: 1.0000 mg | ORAL_TABLET | Freq: Four times a day (QID) | ORAL | Status: DC | PRN
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
## 2013-09-11 ENCOUNTER — Ambulatory Visit (HOSPITAL_COMMUNITY): Payer: Medicaid Other | Admitting: Psychiatry

## 2013-09-12 ENCOUNTER — Encounter (HOSPITAL_COMMUNITY): Payer: Self-pay | Admitting: Psychiatry

## 2013-09-19 ENCOUNTER — Ambulatory Visit (HOSPITAL_COMMUNITY): Payer: Self-pay | Admitting: Psychiatry

## 2013-09-26 ENCOUNTER — Encounter (HOSPITAL_COMMUNITY): Payer: Self-pay | Admitting: Psychiatry

## 2013-09-26 ENCOUNTER — Ambulatory Visit (INDEPENDENT_AMBULATORY_CARE_PROVIDER_SITE_OTHER): Payer: 59 | Admitting: Psychiatry

## 2013-09-26 VITALS — BP 130/90 | Ht 70.0 in | Wt 200.0 lb

## 2013-09-26 DIAGNOSIS — F411 Generalized anxiety disorder: Secondary | ICD-10-CM

## 2013-09-26 DIAGNOSIS — F4310 Post-traumatic stress disorder, unspecified: Secondary | ICD-10-CM

## 2013-09-26 MED ORDER — LAMOTRIGINE 100 MG PO TABS: 100.0000 mg | ORAL_TABLET | Freq: Two times a day (BID) | ORAL | Status: DC

## 2013-09-26 MED ORDER — ALPRAZOLAM 1 MG PO TABS: 1.0000 mg | ORAL_TABLET | Freq: Four times a day (QID) | ORAL | Status: DC | PRN

## 2013-09-26 MED ORDER — FLUOXETINE HCL 20 MG PO CAPS: 20.0000 mg | ORAL_CAPSULE | Freq: Every day | ORAL | Status: DC
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
## 2013-11-26 ENCOUNTER — Ambulatory Visit (HOSPITAL_COMMUNITY): Payer: Self-pay | Admitting: Psychiatry

## 2013-12-03 ENCOUNTER — Encounter (HOSPITAL_COMMUNITY): Payer: Self-pay | Admitting: Psychiatry

## 2013-12-03 ENCOUNTER — Ambulatory Visit (INDEPENDENT_AMBULATORY_CARE_PROVIDER_SITE_OTHER): Payer: 59 | Admitting: Psychiatry

## 2013-12-03 VITALS — BP 160/100 | Ht 70.0 in | Wt 202.0 lb

## 2013-12-03 DIAGNOSIS — F411 Generalized anxiety disorder: Secondary | ICD-10-CM

## 2013-12-03 DIAGNOSIS — F4310 Post-traumatic stress disorder, unspecified: Secondary | ICD-10-CM

## 2013-12-03 MED ORDER — FLUOXETINE HCL 20 MG PO CAPS: 20.0000 mg | ORAL_CAPSULE | Freq: Every day | ORAL | Status: DC

## 2013-12-03 MED ORDER — LAMOTRIGINE 100 MG PO TABS: 100.0000 mg | ORAL_TABLET | Freq: Two times a day (BID) | ORAL | Status: DC

## 2013-12-03 MED ORDER — ALPRAZOLAM 1 MG PO TABS: 1.0000 mg | ORAL_TABLET | Freq: Four times a day (QID) | ORAL | Status: DC | PRN
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
## 2013-12-24 ENCOUNTER — Ambulatory Visit (HOSPITAL_COMMUNITY): Payer: Self-pay | Admitting: Psychology

## 2014-01-30 ENCOUNTER — Ambulatory Visit (HOSPITAL_COMMUNITY): Payer: Self-pay | Admitting: Psychiatry

## 2014-01-30 ENCOUNTER — Encounter (HOSPITAL_COMMUNITY): Payer: Self-pay | Admitting: Psychiatry

## 2014-01-31 ENCOUNTER — Ambulatory Visit (HOSPITAL_COMMUNITY): Payer: Self-pay | Admitting: Psychiatry

## 2014-02-12 ENCOUNTER — Telehealth (HOSPITAL_COMMUNITY): Payer: Self-pay | Admitting: *Deleted

## 2014-02-12 NOTE — Telephone Encounter (Signed)
Pharmacy reports script filled on July 7. Too early to refill

## 2014-02-25 ENCOUNTER — Telehealth (HOSPITAL_COMMUNITY): Payer: Self-pay | Admitting: *Deleted

## 2014-02-25 ENCOUNTER — Other Ambulatory Visit (HOSPITAL_COMMUNITY): Payer: Self-pay | Admitting: Psychiatry

## 2014-02-25 MED ORDER — ALPRAZOLAM 1 MG PO TABS: 1.0000 mg | ORAL_TABLET | Freq: Four times a day (QID) | ORAL | Status: DC | PRN

## 2014-02-25 NOTE — Telephone Encounter (Signed)
Printed so pt can pick up tomorrow

## 2014-03-11 ENCOUNTER — Encounter (HOSPITAL_COMMUNITY): Payer: Self-pay | Admitting: Psychiatry

## 2014-03-11 ENCOUNTER — Ambulatory Visit (HOSPITAL_COMMUNITY): Payer: Self-pay | Admitting: Psychiatry

## 2014-03-20 ENCOUNTER — Other Ambulatory Visit (HOSPITAL_COMMUNITY): Payer: Self-pay | Admitting: Psychiatry

## 2014-03-20 ENCOUNTER — Ambulatory Visit (INDEPENDENT_AMBULATORY_CARE_PROVIDER_SITE_OTHER): Payer: 59 | Admitting: Psychiatry

## 2014-03-20 ENCOUNTER — Encounter (HOSPITAL_COMMUNITY): Payer: Self-pay | Admitting: *Deleted

## 2014-03-20 ENCOUNTER — Encounter (HOSPITAL_COMMUNITY): Payer: Self-pay | Admitting: Psychiatry

## 2014-03-20 VITALS — BP 153/93 | HR 89 | Ht 70.0 in | Wt 198.8 lb

## 2014-03-20 DIAGNOSIS — F411 Generalized anxiety disorder: Secondary | ICD-10-CM

## 2014-03-20 DIAGNOSIS — F4310 Post-traumatic stress disorder, unspecified: Secondary | ICD-10-CM

## 2014-03-20 MED ORDER — ALPRAZOLAM 1 MG PO TABS: 1.0000 mg | ORAL_TABLET | Freq: Four times a day (QID) | ORAL | Status: DC | PRN

## 2014-03-20 MED ORDER — FLUOXETINE HCL 20 MG PO CAPS: 20.0000 mg | ORAL_CAPSULE | Freq: Every day | ORAL | Status: DC

## 2014-03-20 MED ORDER — LAMOTRIGINE 100 MG PO TABS: 100.0000 mg | ORAL_TABLET | Freq: Two times a day (BID) | ORAL | Status: DC
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
## 2014-04-25 ENCOUNTER — Encounter (HOSPITAL_COMMUNITY): Payer: Self-pay | Admitting: Psychiatry

## 2014-04-25 ENCOUNTER — Ambulatory Visit (INDEPENDENT_AMBULATORY_CARE_PROVIDER_SITE_OTHER): Payer: 59 | Admitting: Psychiatry

## 2014-04-25 VITALS — BP 153/107 | HR 85 | Ht 68.0 in | Wt 199.0 lb

## 2014-04-25 DIAGNOSIS — F411 Generalized anxiety disorder: Secondary | ICD-10-CM

## 2014-04-25 DIAGNOSIS — F4310 Post-traumatic stress disorder, unspecified: Secondary | ICD-10-CM

## 2014-04-25 MED ORDER — FLUOXETINE HCL 20 MG PO CAPS: 20.0000 mg | ORAL_CAPSULE | Freq: Every day | ORAL | Status: DC

## 2014-04-25 MED ORDER — ALPRAZOLAM 1 MG PO TABS: 1.0000 mg | ORAL_TABLET | Freq: Four times a day (QID) | ORAL | Status: DC | PRN

## 2014-04-25 MED ORDER — LAMOTRIGINE 100 MG PO TABS: 100.0000 mg | ORAL_TABLET | Freq: Two times a day (BID) | ORAL | Status: DC
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
## 2014-07-29 ENCOUNTER — Encounter (HOSPITAL_COMMUNITY): Payer: Self-pay | Admitting: Psychiatry

## 2014-07-29 ENCOUNTER — Ambulatory Visit (INDEPENDENT_AMBULATORY_CARE_PROVIDER_SITE_OTHER): Payer: MEDICAID | Admitting: Psychiatry

## 2014-07-29 VITALS — BP 150/98 | HR 86 | Ht 68.0 in | Wt 200.8 lb

## 2014-07-29 DIAGNOSIS — F411 Generalized anxiety disorder: Secondary | ICD-10-CM

## 2014-07-29 DIAGNOSIS — F4310 Post-traumatic stress disorder, unspecified: Secondary | ICD-10-CM

## 2014-07-29 MED ORDER — ALPRAZOLAM 1 MG PO TABS: 1.0000 mg | ORAL_TABLET | Freq: Four times a day (QID) | ORAL | Status: DC | PRN

## 2014-07-29 MED ORDER — LAMOTRIGINE 100 MG PO TABS: 100.0000 mg | ORAL_TABLET | Freq: Two times a day (BID) | ORAL | Status: DC

## 2014-07-29 MED ORDER — FLUOXETINE HCL 40 MG PO CAPS: 40.0000 mg | ORAL_CAPSULE | Freq: Every day | ORAL | Status: DC
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
## 2014-08-26 ENCOUNTER — Ambulatory Visit (HOSPITAL_COMMUNITY): Payer: Self-pay | Admitting: Psychology

## 2014-09-19 ENCOUNTER — Ambulatory Visit (INDEPENDENT_AMBULATORY_CARE_PROVIDER_SITE_OTHER): Payer: MEDICAID | Admitting: Psychology

## 2014-09-19 DIAGNOSIS — F411 Generalized anxiety disorder: Secondary | ICD-10-CM | POA: Diagnosis not present

## 2014-09-19 DIAGNOSIS — F32A Depression, unspecified: Secondary | ICD-10-CM

## 2014-09-19 DIAGNOSIS — F4310 Post-traumatic stress disorder, unspecified: Secondary | ICD-10-CM

## 2014-09-19 DIAGNOSIS — F329 Major depressive disorder, single episode, unspecified: Secondary | ICD-10-CM

## 2014-10-13 ENCOUNTER — Ambulatory Visit (INDEPENDENT_AMBULATORY_CARE_PROVIDER_SITE_OTHER): Payer: MEDICAID | Admitting: Psychology

## 2014-10-13 DIAGNOSIS — F329 Major depressive disorder, single episode, unspecified: Secondary | ICD-10-CM | POA: Diagnosis not present

## 2014-10-13 DIAGNOSIS — F32A Depression, unspecified: Secondary | ICD-10-CM

## 2014-10-13 DIAGNOSIS — F4310 Post-traumatic stress disorder, unspecified: Secondary | ICD-10-CM | POA: Diagnosis not present

## 2014-10-13 DIAGNOSIS — F411 Generalized anxiety disorder: Secondary | ICD-10-CM

## 2014-10-28 ENCOUNTER — Ambulatory Visit (INDEPENDENT_AMBULATORY_CARE_PROVIDER_SITE_OTHER): Payer: MEDICAID | Admitting: Psychiatry

## 2014-10-28 ENCOUNTER — Encounter (HOSPITAL_COMMUNITY): Payer: Self-pay | Admitting: Psychiatry

## 2014-10-28 VITALS — BP 161/121 | HR 88 | Ht 68.0 in | Wt 191.2 lb

## 2014-10-28 DIAGNOSIS — F4310 Post-traumatic stress disorder, unspecified: Secondary | ICD-10-CM

## 2014-10-28 DIAGNOSIS — F411 Generalized anxiety disorder: Secondary | ICD-10-CM | POA: Diagnosis not present

## 2014-10-28 MED ORDER — ALPRAZOLAM 1 MG PO TABS: 1.0000 mg | ORAL_TABLET | Freq: Four times a day (QID) | ORAL | Status: DC | PRN

## 2014-10-28 MED ORDER — FLUOXETINE HCL 40 MG PO CAPS: 40.0000 mg | ORAL_CAPSULE | Freq: Every day | ORAL | Status: DC

## 2014-10-28 MED ORDER — LAMOTRIGINE 100 MG PO TABS: 100.0000 mg | ORAL_TABLET | Freq: Two times a day (BID) | ORAL | Status: DC
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
## 2014-11-13 ENCOUNTER — Ambulatory Visit (INDEPENDENT_AMBULATORY_CARE_PROVIDER_SITE_OTHER): Payer: MEDICAID | Admitting: Psychology

## 2014-11-13 DIAGNOSIS — F411 Generalized anxiety disorder: Secondary | ICD-10-CM | POA: Diagnosis not present

## 2014-11-13 DIAGNOSIS — F4310 Post-traumatic stress disorder, unspecified: Secondary | ICD-10-CM | POA: Diagnosis not present

## 2014-11-13 DIAGNOSIS — F329 Major depressive disorder, single episode, unspecified: Secondary | ICD-10-CM | POA: Diagnosis not present

## 2014-11-13 DIAGNOSIS — F32A Depression, unspecified: Secondary | ICD-10-CM

## 2014-11-20 ENCOUNTER — Ambulatory Visit (INDEPENDENT_AMBULATORY_CARE_PROVIDER_SITE_OTHER): Payer: MEDICAID | Admitting: Psychiatry

## 2014-11-20 ENCOUNTER — Encounter (HOSPITAL_COMMUNITY): Payer: Self-pay | Admitting: Psychiatry

## 2014-11-20 VITALS — BP 145/97 | HR 100 | Ht 68.0 in | Wt 196.4 lb

## 2014-11-20 DIAGNOSIS — F411 Generalized anxiety disorder: Secondary | ICD-10-CM

## 2014-11-20 DIAGNOSIS — F4310 Post-traumatic stress disorder, unspecified: Secondary | ICD-10-CM
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
## 2014-11-24 ENCOUNTER — Telehealth (HOSPITAL_COMMUNITY): Payer: Self-pay | Admitting: *Deleted

## 2014-11-24 NOTE — Telephone Encounter (Signed)
Pt came into office with a form for Dr. Tenny Crawoss to fill out. [place] [facility] [facility]. After Dr. Tenny Crawoss filled out form, pt took form with him stating that he will turn it in. Before walked out of office, a copy was made and sent to scan center to be scanned into his chart.

## 2014-12-08 ENCOUNTER — Encounter (HOSPITAL_COMMUNITY): Payer: Self-pay | Admitting: Psychology

## 2014-12-08 ENCOUNTER — Ambulatory Visit (INDEPENDENT_AMBULATORY_CARE_PROVIDER_SITE_OTHER): Payer: MEDICAID | Admitting: Psychology

## 2014-12-08 DIAGNOSIS — F4310 Post-traumatic stress disorder, unspecified: Secondary | ICD-10-CM | POA: Diagnosis not present

## 2014-12-08 NOTE — Progress Notes (Addendum)
The patient reports that he is going through the bankruptsy process and working on getting disability.  His mood has been a little bit better, but still a lot of stress around his PTSD.  MRI on neck.   Cody Walls,Cody Ayotte R, PsyD 12/08/2014

## 2014-12-24 ENCOUNTER — Telehealth (HOSPITAL_COMMUNITY): Payer: Self-pay | Admitting: *Deleted

## 2014-12-24 ENCOUNTER — Other Ambulatory Visit (HOSPITAL_COMMUNITY): Payer: Self-pay | Admitting: Psychiatry

## 2014-12-24 NOTE — Telephone Encounter (Signed)
Pt called stating that he need refills on his Xanax. Per pt he is out of his medication [phone]. Per pt pharmacy, pt last pick up date was 11-23-14. Per pt, his last office f/u appt on 11-20-14. And did not realize he did not get new script.

## 2014-12-24 NOTE — Telephone Encounter (Signed)
He needs to find latest script

## 2014-12-25 ENCOUNTER — Telehealth (HOSPITAL_COMMUNITY): Payer: Self-pay | Admitting: *Deleted

## 2014-12-25 MED ORDER — ALPRAZOLAM 1 MG PO TABS: 1.0000 mg | ORAL_TABLET | Freq: Four times a day (QID) | ORAL | Status: DC | PRN

## 2014-12-25 NOTE — Telephone Encounter (Signed)
lmtcb

## 2014-12-25 NOTE — Telephone Encounter (Signed)
Per resent request, provider (Dr. Tenny Crawoss) approved to call in pt Xanax with 2 refills. Pt is aware. Spoke with Herbert SetaHeather at pt pharmacy and she stated that she will get pt script ready

## 2014-12-25 NOTE — Telephone Encounter (Signed)
Pt Xanax was approved by Dr.Ross to be called into pt pharmacy. Pt informed and agreed. Called pharmacy and spoke with Herbert SetaHeather and she stated that she will get pt medication ready for him to pick up

## 2015-01-08 ENCOUNTER — Ambulatory Visit (INDEPENDENT_AMBULATORY_CARE_PROVIDER_SITE_OTHER): Payer: MEDICAID | Admitting: Psychology

## 2015-01-08 ENCOUNTER — Encounter (HOSPITAL_COMMUNITY): Payer: Self-pay | Admitting: Psychology

## 2015-01-08 DIAGNOSIS — F4310 Post-traumatic stress disorder, unspecified: Secondary | ICD-10-CM
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
## 2015-01-08 NOTE — Progress Notes (Deleted)
The patient has contineud to be stressed about bankrupsy and finicial issues.  The patient has been trying to stay busy but heat intolerance his too much for him.  Pinched nerve is causing more issues.  Sleep is at times disturebed.  PTSD sympomts continued.  Difficulty moving both arms.  Depression has been improved to some degree and medications are helping but the PTSD, stress, depression, and anxieyt contine.    Hershal Coria, PsyD 01/08/2015
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
## 2015-01-27 ENCOUNTER — Ambulatory Visit (HOSPITAL_COMMUNITY): Payer: Self-pay | Admitting: Psychiatry

## 2015-01-27 ENCOUNTER — Other Ambulatory Visit (HOSPITAL_COMMUNITY): Payer: Self-pay | Admitting: Psychiatry

## 2015-01-28 ENCOUNTER — Telehealth (HOSPITAL_COMMUNITY): Payer: Self-pay | Admitting: *Deleted

## 2015-01-28 ENCOUNTER — Other Ambulatory Visit (HOSPITAL_COMMUNITY): Payer: Self-pay | Admitting: Psychiatry

## 2015-01-28 MED ORDER — FLUOXETINE HCL 40 MG PO CAPS: 40.0000 mg | ORAL_CAPSULE | Freq: Every day | ORAL | Status: DC

## 2015-01-28 NOTE — Telephone Encounter (Signed)
NEED REFILL FOR FLUOXETINE

## 2015-01-28 NOTE — Telephone Encounter (Signed)
NEED REFILL FOR FLUOXETINE 

## 2015-01-28 NOTE — Telephone Encounter (Signed)
sent 

## 2015-01-28 NOTE — Telephone Encounter (Signed)
Noted  

## 2015-01-29 ENCOUNTER — Ambulatory Visit (INDEPENDENT_AMBULATORY_CARE_PROVIDER_SITE_OTHER): Payer: Medicaid Other | Admitting: Psychology

## 2015-01-29 ENCOUNTER — Encounter (HOSPITAL_COMMUNITY): Payer: Self-pay | Admitting: Emergency Medicine

## 2015-01-29 ENCOUNTER — Emergency Department (HOSPITAL_COMMUNITY)
Admission: EM | Admit: 2015-01-29 | Discharge: 2015-01-29 | Disposition: A | Discharge status: Home / Self Care | Payer: Medicaid Other | Attending: Emergency Medicine | Admitting: Emergency Medicine

## 2015-01-29 ENCOUNTER — Encounter (HOSPITAL_COMMUNITY): Payer: Self-pay | Admitting: Psychology

## 2015-01-29 DIAGNOSIS — F411 Generalized anxiety disorder: Secondary | ICD-10-CM | POA: Diagnosis not present

## 2015-01-29 DIAGNOSIS — F419 Anxiety disorder, unspecified: Secondary | ICD-10-CM | POA: Insufficient documentation

## 2015-01-29 DIAGNOSIS — G629 Polyneuropathy, unspecified: Secondary | ICD-10-CM | POA: Insufficient documentation

## 2015-01-29 DIAGNOSIS — F4310 Post-traumatic stress disorder, unspecified: Secondary | ICD-10-CM | POA: Insufficient documentation

## 2015-01-29 DIAGNOSIS — R21 Rash and other nonspecific skin eruption: Secondary | ICD-10-CM | POA: Diagnosis present

## 2015-01-29 DIAGNOSIS — Z8739 Personal history of other diseases of the musculoskeletal system and connective tissue: Secondary | ICD-10-CM | POA: Diagnosis not present

## 2015-01-29 DIAGNOSIS — F329 Major depressive disorder, single episode, unspecified: Secondary | ICD-10-CM | POA: Diagnosis not present

## 2015-01-29 DIAGNOSIS — L255 Unspecified contact dermatitis due to plants, except food: Secondary | ICD-10-CM | POA: Insufficient documentation

## 2015-01-29 DIAGNOSIS — G8929 Other chronic pain: Secondary | ICD-10-CM | POA: Diagnosis not present

## 2015-01-29 DIAGNOSIS — Z87442 Personal history of urinary calculi: Secondary | ICD-10-CM | POA: Diagnosis not present

## 2015-01-29 DIAGNOSIS — L089 Local infection of the skin and subcutaneous tissue, unspecified: Secondary | ICD-10-CM | POA: Diagnosis not present

## 2015-01-29 DIAGNOSIS — Z7982 Long term (current) use of aspirin: Secondary | ICD-10-CM | POA: Diagnosis not present

## 2015-01-29 DIAGNOSIS — L237 Allergic contact dermatitis due to plants, except food: Secondary | ICD-10-CM

## 2015-01-29 DIAGNOSIS — Z79899 Other long term (current) drug therapy: Secondary | ICD-10-CM | POA: Insufficient documentation

## 2015-01-29 DIAGNOSIS — Z720 Tobacco use: Secondary | ICD-10-CM | POA: Insufficient documentation

## 2015-01-29 DIAGNOSIS — F32A Depression, unspecified: Secondary | ICD-10-CM

## 2015-01-29 MED ORDER — DOXYCYCLINE HYCLATE 100 MG PO CAPS: 100.0000 mg | ORAL_CAPSULE | Freq: Two times a day (BID) | ORAL | Status: DC

## 2015-01-29 NOTE — ED Notes (Signed)
Pt c/o itching rash to chest x several weeks. Denies fevers/swelling. nad noted.

## 2015-01-29 NOTE — Discharge Instructions (Signed)
Poison [employer] Poison ivy is a inflammation of the skin (contact dermatitis) caused by touching the allergens on the leaves of the ivy plant following previous exposure to the plant. The rash usually appears 48 hours after exposure. The rash is usually bumps (papules) or blisters (vesicles) in a linear pattern. Depending on your own sensitivity, the rash may simply cause redness and itching, or it may also progress to blisters which may break open. These must be well cared for to prevent secondary bacterial (germ) infection, followed by scarring. Keep any open areas dry, clean, dressed, and covered with an antibacterial ointment if needed. The eyes may also get puffy. The puffiness is worst in the morning and gets better as the day progresses. This dermatitis usually heals without scarring, within 2 to 3 weeks without treatment. HOME CARE INSTRUCTIONS  Thoroughly wash with soap and water as soon as you have been exposed to poison ivy. You have about one half hour to remove the plant resin before it will cause the rash. This washing will destroy the oil or antigen on the skin that is causing, or will cause, the rash. Be sure to wash under your fingernails as any plant resin there will continue to spread the rash. Do not rub skin vigorously when washing affected area. Poison ivy cannot spread if no oil from the plant remains on your body. A rash that has progressed to weeping sores will not spread the rash unless you have not washed thoroughly. It is also important to wash any clothes you have been wearing as these may carry active allergens. The rash will return if you wear the unwashed clothing, even several days later. Avoidance of the plant in the future is the best measure. Poison ivy plant can be recognized by the number of leaves. Generally, poison ivy has three leaves with flowering branches on a single stem. Diphenhydramine may be purchased over the counter and used as needed for itching. Do not drive with  this medication if it makes you drowsy.Ask your caregiver about medication for children. SEEK MEDICAL CARE IF:  Open sores develop.  Redness spreads beyond area of rash.  You notice purulent (pus-like) discharge.  You have increased pain.  Other signs of infection develop (such as fever). Document Released: 07/08/2000 Document Revised: 10/03/2011 Document Reviewed: 12/19/2008 [facility] Patient Information 2015 [place]. This information is not intended to replace advice given to you by your health care provider. Make sure you discuss any questions you have with your health care provider. Wound Infection A wound infection happens when a type of germ (bacteria) starts growing in the wound. In some cases, this can cause the wound to break open. If cared for properly, the infected wound will heal from the inside to the outside. Wound infections need treatment. CAUSES An infection is caused by bacteria growing in the wound.  SYMPTOMS   Increase in redness, swelling, or pain at the wound site.  Increase in drainage at the wound site.  Wound or bandage (dressing) starts to smell bad.  Fever.  Feeling tired or fatigued.  Pus draining from the wound. TREATMENT  Your health care provider will prescribe antibiotic medicine. The wound infection should improve within 24 to 48 hours. Any redness around the wound should stop spreading and the wound should be less painful.  HOME CARE INSTRUCTIONS   Only take over-the-counter or prescription medicines for pain, discomfort, or fever as directed by your health care provider.  Take your antibiotics as directed. Finish them  even if you start to feel better.  Gently wash the area with mild soap and water 2 times a day, or as directed. Rinse off the soap. Pat the area dry with a clean towel. Do not rub the wound. This may cause bleeding.  Follow your health care provider's instructions for how often you need to change the dressing.  Apply  ointment and a dressing to the wound as directed.  If the dressing sticks, moisten it with soapy water and gently remove it.  Change the bandage right away if it becomes wet, dirty, or develops a bad smell.  Take showers. Do not take tub baths, swim, or do anything that may soak the wound until it is healed.  Avoid exercises that make you sweat heavily.  Use anti-itch medicine as directed by your health care provider. The wound may itch when it is healing. Do not pick or scratch at the wound.  Follow up with your health care provider to get your wound rechecked as directed. SEEK MEDICAL CARE IF:  You have an increase in swelling, pain, or redness around the wound.  You have an increase in the amount of pus coming from the wound.  There is a bad smell coming from the wound.  More of the wound breaks open.  You have a fever. MAKE SURE YOU:   Understand these instructions.  Will watch your condition.  Will get help right away if you are not doing well or get worse. Document Released: 04/09/2003 Document Revised: 07/16/2013 Document Reviewed: 11/14/2010 [facility] Patient Information 2015 [place], [place]. This information is not intended to replace advice given to you by your health care provider. Make sure you discuss any questions you have with your health care provider.

## 2015-01-29 NOTE — ED Provider Notes (Signed)
CSN: [id number]     Arrival date & time 01/29/15  7829 History   First MD Initiated Contact with Patient 01/29/15 1009     Chief Complaint  Patient presents with  . Rash     (Consider location/radiation/quality/duration/timing/severity/associated sxs/prior Treatment) Patient is a 44 y.o. male presenting with rash. The history is provided by the patient. No language interpreter was used.  Rash Location:  Torso Torso rash location:  L chest Quality: itchiness   Quality: not swelling   Severity:  Moderate Onset quality:  Gradual Progression:  Worsening Chronicity:  New Context: plant contact   Relieved by:  Nothing Worsened by:  Nothing tried Ineffective treatments:  None tried Associated symptoms: no fever   Pt complains of itching from poison ivy.   Pt reports an area on his chest has increased in redness and has drainage   Past Medical History  Diagnosis Date  . Kidney stones   . Anxiety   . Blood poisoning   . Bursitis   . Tendonitis   . Chronic back pain   . PTSD (post-traumatic stress disorder)   . Torn rotator cuff    Past Surgical History  Procedure Laterality Date  . Kidney stone surgery     Family History  Problem Relation Age of Onset  . Heart failure Other   . Alcohol abuse Father    History  Substance Use Topics  . Smoking status: Current Every Day Smoker -- 1.50 packs/day for 28 years    Types: Cigarettes  . Smokeless tobacco: Never Used  . Alcohol Use: 1.8 oz/week    3 Cans of beer per week     Comment: occasional    Review of Systems  Constitutional: Negative for fever.  Skin: Positive for rash.  All other systems reviewed and are negative.     Allergies  Review of patient's allergies indicates no known allergies.  Home Medications   Prior to Admission medications   Medication Sig Start Date End Date Taking? Authorizing Provider  ALPRAZolam Prudy Feeler) 1 MG tablet Take 1 tablet (1 mg total) by mouth 4 (four) times daily as needed for  sleep or anxiety. 12/25/14  Yes Myrlene Broker, MD  aspirin EC 81 MG tablet Take 81 mg by mouth daily.   Yes Historical Provider, MD  FLUoxetine (PROZAC) 40 MG capsule Take 1 capsule (40 mg total) by mouth daily. 01/28/15 01/28/16 Yes Myrlene Broker, MD  HYDROcodone-acetaminophen (NORCO) 7.5-325 MG per tablet Take 1 tablet by mouth every 6 (six) hours as needed for moderate pain.   Yes Historical Provider, MD  ibuprofen (ADVIL,MOTRIN) 200 MG tablet Take 200 mg by mouth every 6 (six) hours as needed for pain.   Yes Historical Provider, MD  lamoTRIgine (LAMICTAL) 100 MG tablet Take 1 tablet (100 mg total) by mouth 2 (two) times daily. 10/28/14  Yes Myrlene Broker, MD  naproxen (NAPROSYN) 500 MG tablet Take 500 mg by mouth daily as needed for moderate pain.    Yes Historical Provider, MD  omeprazole (PRILOSEC) 40 MG capsule Take 40 mg by mouth as needed (acid reflux).    Yes Historical Provider, MD  tiZANidine (ZANAFLEX) 4 MG tablet Take 4 mg by mouth every 6 (six) hours as needed for muscle spasms.   Yes Historical Provider, MD  doxycycline (VIBRAMYCIN) 100 MG capsule Take 1 capsule (100 mg total) by mouth 2 (two) times daily. 01/29/15   Elson Areas, PA-C  ketorolac (TORADOL) 10 MG tablet Take 10 mg  by mouth every 6 (six) hours as needed for moderate pain.     Historical Provider, MD   BP 180/112 mmHg  Pulse 86  Temp(Src) 98.2 F (36.8 C)  Resp 18  Ht 5\' 9"  (1.753 m)  Wt 200 lb (90.719 kg)  BMI 29.52 kg/m2  SpO2 97% Physical Exam  Constitutional: He appears well-developed.  HENT:  Head: Normocephalic.  Cardiovascular: Normal rate.   Musculoskeletal: Normal range of motion.  Neurological: A cranial nerve deficit is present.  Skin: There is erythema.  muliple dried skin lesions, looks like poison ivy,  Left chest erythema around around dried lesions, small pimple/pustules    ED Course  Procedures (including critical care time) Labs Review Labs Reviewed - No data to display  Imaging  Review No results found.   EKG Interpretation None      MDM   Final diagnoses:  Skin infection  Poison ivy    Doxycycline  Warm compresses See your Physician for recheck if symptoms persist    Elson AreasLeslie K Sofia, PA-C 01/29/15 1052  Bethann BerkshireJoseph Zammit, MD 01/29/15 [phone]
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
## 2015-02-25 ENCOUNTER — Other Ambulatory Visit (HOSPITAL_COMMUNITY): Payer: Self-pay | Admitting: Psychiatry

## 2015-03-04 ENCOUNTER — Ambulatory Visit (HOSPITAL_COMMUNITY): Payer: Self-pay | Admitting: Psychiatry

## 2015-03-05 ENCOUNTER — Ambulatory Visit (INDEPENDENT_AMBULATORY_CARE_PROVIDER_SITE_OTHER): Payer: Medicaid Other | Admitting: Psychology

## 2015-03-05 DIAGNOSIS — F329 Major depressive disorder, single episode, unspecified: Secondary | ICD-10-CM

## 2015-03-05 DIAGNOSIS — F32A Depression, unspecified: Secondary | ICD-10-CM

## 2015-03-05 DIAGNOSIS — F4310 Post-traumatic stress disorder, unspecified: Secondary | ICD-10-CM

## 2015-03-05 DIAGNOSIS — F411 Generalized anxiety disorder: Secondary | ICD-10-CM | POA: Diagnosis not present
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
## 2015-03-24 ENCOUNTER — Ambulatory Visit (INDEPENDENT_AMBULATORY_CARE_PROVIDER_SITE_OTHER): Payer: Medicaid Other | Admitting: Psychiatry

## 2015-03-24 ENCOUNTER — Encounter (HOSPITAL_COMMUNITY): Payer: Self-pay | Admitting: Psychiatry

## 2015-03-24 VITALS — BP 160/107 | HR 76 | Ht 69.0 in | Wt 197.2 lb

## 2015-03-24 DIAGNOSIS — F4310 Post-traumatic stress disorder, unspecified: Secondary | ICD-10-CM

## 2015-03-24 DIAGNOSIS — F411 Generalized anxiety disorder: Secondary | ICD-10-CM

## 2015-03-24 MED ORDER — FLUOXETINE HCL 40 MG PO CAPS: 40.0000 mg | ORAL_CAPSULE | Freq: Every day | ORAL | Status: DC

## 2015-03-24 MED ORDER — ALPRAZOLAM 1 MG PO TABS: 1.0000 mg | ORAL_TABLET | Freq: Four times a day (QID) | ORAL | Status: DC | PRN

## 2015-03-24 MED ORDER — LAMOTRIGINE 100 MG PO TABS: 100.0000 mg | ORAL_TABLET | Freq: Two times a day (BID) | ORAL | Status: DC
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
## 2015-04-06 ENCOUNTER — Ambulatory Visit (HOSPITAL_COMMUNITY): Payer: Self-pay | Admitting: Psychology

## 2015-04-29 ENCOUNTER — Ambulatory Visit (HOSPITAL_COMMUNITY): Payer: Self-pay | Admitting: Psychology

## 2015-05-15 ENCOUNTER — Ambulatory Visit (INDEPENDENT_AMBULATORY_CARE_PROVIDER_SITE_OTHER): Payer: Medicaid Other | Admitting: Psychology

## 2015-05-15 ENCOUNTER — Encounter (HOSPITAL_COMMUNITY): Payer: Self-pay | Admitting: Psychology

## 2015-05-15 DIAGNOSIS — F329 Major depressive disorder, single episode, unspecified: Secondary | ICD-10-CM | POA: Diagnosis not present

## 2015-05-15 DIAGNOSIS — F4310 Post-traumatic stress disorder, unspecified: Secondary | ICD-10-CM

## 2015-05-15 DIAGNOSIS — F411 Generalized anxiety disorder: Secondary | ICD-10-CM | POA: Diagnosis not present

## 2015-05-15 DIAGNOSIS — F32A Depression, unspecified: Secondary | ICD-10-CM
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
## 2015-05-21 ENCOUNTER — Encounter (HOSPITAL_COMMUNITY): Payer: Self-pay | Admitting: Psychiatry

## 2015-05-21 ENCOUNTER — Ambulatory Visit (INDEPENDENT_AMBULATORY_CARE_PROVIDER_SITE_OTHER): Payer: Medicaid Other | Admitting: Psychiatry

## 2015-05-21 VITALS — BP 172/120 | HR 76 | Ht 69.0 in | Wt 204.4 lb

## 2015-05-21 DIAGNOSIS — F411 Generalized anxiety disorder: Secondary | ICD-10-CM | POA: Diagnosis not present

## 2015-05-21 DIAGNOSIS — F329 Major depressive disorder, single episode, unspecified: Secondary | ICD-10-CM

## 2015-05-21 DIAGNOSIS — F4310 Post-traumatic stress disorder, unspecified: Secondary | ICD-10-CM

## 2015-05-21 DIAGNOSIS — F32A Depression, unspecified: Secondary | ICD-10-CM

## 2015-05-21 MED ORDER — FLUOXETINE HCL 40 MG PO CAPS: 40.0000 mg | ORAL_CAPSULE | Freq: Every day | ORAL | Status: DC

## 2015-05-21 MED ORDER — LAMOTRIGINE 100 MG PO TABS: 100.0000 mg | ORAL_TABLET | Freq: Two times a day (BID) | ORAL | Status: DC

## 2015-05-21 MED ORDER — ALPRAZOLAM 1 MG PO TABS: 1.0000 mg | ORAL_TABLET | Freq: Four times a day (QID) | ORAL | Status: DC | PRN
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
## 2015-06-15 ENCOUNTER — Ambulatory Visit (HOSPITAL_COMMUNITY): Payer: Self-pay | Admitting: Psychiatry

## 2015-08-11 ENCOUNTER — Other Ambulatory Visit (HOSPITAL_COMMUNITY): Payer: Self-pay | Admitting: Psychiatry

## 2015-08-20 ENCOUNTER — Encounter (HOSPITAL_COMMUNITY): Payer: Self-pay | Admitting: Psychiatry

## 2015-08-20 ENCOUNTER — Ambulatory Visit (INDEPENDENT_AMBULATORY_CARE_PROVIDER_SITE_OTHER): Payer: Medicaid Other | Admitting: Psychiatry

## 2015-08-20 VITALS — BP 155/106 | HR 95 | Ht 69.0 in | Wt 190.2 lb

## 2015-08-20 DIAGNOSIS — F4310 Post-traumatic stress disorder, unspecified: Secondary | ICD-10-CM

## 2015-08-20 DIAGNOSIS — F411 Generalized anxiety disorder: Secondary | ICD-10-CM

## 2015-08-20 MED ORDER — ALPRAZOLAM 1 MG PO TABS: 1.0000 mg | ORAL_TABLET | Freq: Four times a day (QID) | ORAL | Status: DC | PRN

## 2015-08-20 MED ORDER — LAMOTRIGINE 100 MG PO TABS: 100.0000 mg | ORAL_TABLET | Freq: Two times a day (BID) | ORAL | Status: DC

## 2015-08-20 MED ORDER — FLUOXETINE HCL 40 MG PO CAPS: 40.0000 mg | ORAL_CAPSULE | Freq: Every day | ORAL | Status: DC
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
## 2015-09-10 ENCOUNTER — Encounter: Payer: Self-pay | Admitting: Orthopaedic Surgery

## 2015-09-10 ENCOUNTER — Ambulatory Visit (INDEPENDENT_AMBULATORY_CARE_PROVIDER_SITE_OTHER): Payer: Medicaid Other | Admitting: Orthopaedic Surgery

## 2015-09-10 VITALS — BP 101/68 | HR 56 | Temp 99.1°F | Resp 16 | Ht 69.0 in | Wt 182.0 lb

## 2015-09-10 DIAGNOSIS — M545 Low back pain, unspecified: Secondary | ICD-10-CM | POA: Insufficient documentation

## 2015-09-10 MED ORDER — HYDROCODONE-ACETAMINOPHEN 7.5-325 MG PO TABS: 1.0000 | ORAL_TABLET | ORAL | Status: DC | PRN

## 2015-09-10 NOTE — Progress Notes (Signed)
Patient HQ:Cody Walls, male DOB:09-Jan-1971, 45 y.o. XBM:[id number]  Chief Complaint  Patient presents with  . Follow-up    follow up low back pain, "same"    HPI  Cody Walls is a 45 y.o. male who has chronic back pain.  Back Pain This is a chronic problem. The current episode started more than 1 year ago. The problem occurs every several days. The problem has been waxing and waning since onset. The pain is present in the lumbar spine. The quality of the pain is described as aching. The pain does not radiate. The pain is at a severity of 3/10. The pain is mild. The pain is worse during the day. The symptoms are aggravated by bending, standing and twisting. He has tried analgesics, bed rest, home exercises, heat, ice, NSAIDs and walking for the symptoms. The treatment provided moderate relief.    Body mass index is 26.86 kg/(m^2).  Review of Systems  Patient does not have Diabetes Mellitus. Patient has hypertension. Patient does not have COPD or shortness of breath. Patient does not have BMI > 35. Patient has current smoking history.  Review of Systems  Genitourinary:       History of kidney stone   Musculoskeletal: Positive for myalgias and back pain.  Psychiatric/Behavioral: The patient is nervous/anxious.   All other systems reviewed and are negative.   Past Medical History  Diagnosis Date  . Kidney stones   . Anxiety   . Blood poisoning (HCC)   . Bursitis   . Tendonitis   . Chronic back pain   . PTSD (post-traumatic stress disorder)   . Torn rotator cuff     Past Surgical History  Procedure Laterality Date  . Kidney stone surgery      Family History  Problem Relation Age of Onset  . Heart failure Other   . Alcohol abuse Father     Social History Social History  Substance Use Topics  . Smoking status: Current Every Day Smoker -- 1.50 packs/day for 28 years    Types: Cigarettes  . Smokeless tobacco: Never Used  . Alcohol Use: 1.8 oz/week    3 Cans  of beer per week     Comment: occasional    No Known Allergies  Current Outpatient Prescriptions  Medication Sig Dispense Refill  . ALPRAZolam (XANAX) 1 MG tablet Take 1 tablet (1 mg total) by mouth 4 (four) times daily as needed for sleep or anxiety. 120 tablet 2  . aspirin EC 81 MG tablet Take 81 mg by mouth daily.    Marland Kitchen FLUoxetine (PROZAC) 40 MG capsule Take 1 capsule (40 mg total) by mouth daily. 30 capsule 2  . ibuprofen (ADVIL,MOTRIN) 200 MG tablet Take 200 mg by mouth every 6 (six) hours as needed for pain.    Marland Kitchen lamoTRIgine (LAMICTAL) 100 MG tablet Take 1 tablet (100 mg total) by mouth 2 (two) times daily. 60 tablet 2  . omeprazole (PRILOSEC) 40 MG capsule Take 40 mg by mouth as needed (acid reflux).     Marland Kitchen tiZANidine (ZANAFLEX) 4 MG tablet Take 4 mg by mouth every 6 (six) hours as needed for muscle spasms.    Marland Kitchen HYDROcodone-acetaminophen (NORCO) 7.5-325 MG tablet Take 1 tablet by mouth every 4 (four) hours as needed for moderate pain (Must last 30 days.  Do not drive or operate machinery while taking this medicine.). 120 tablet 0   No current facility-administered medications for this visit.     Physical Exam  Blood  pressure 101/68, pulse 56, temperature 99.1 F (37.3 C), resp. rate 16, height  (1.753 m), weight 182 lb (82.555 kg).  Constitutional: overall normal hygiene, normal nutrition, well developed, normal grooming, normal body habitus. Assistive device:none  Musculoskeletal: gait and station Limp none, muscle tone and strength are normal, no tremors or atrophy is present.  .  Neurological: coordination overall normal.  Deep tendon reflex/nerve stretch intact.  Sensation normal.  Cranial nerves II-XII intact.   Skin:   normal overall no scars, lesions, ulcers or rashes. No psoriasis.  Psychiatric: Alert and oriented x 3.  Recent memory intact, remote memory unclear.  Normal mood and affect. Well groomed.  Good eye contact.  Cardiovascular: overall no swelling, no  varicosities, no edema bilaterally, normal temperatures of the legs and arms, no clubbing, cyanosis and good capillary refill.  Lymphatic: palpation is normal.  Spine/Pelvis examination:  Inspection:  Overall, sacoiliac joint benign and hips tender; without crepitus or defects.   Thoracic spine inspection: Alignment normal without kyphosis present   Lumbar spine inspection:  Alignment  with normal lumbar lordosis, without scoliosis apparent.   Thoracic spine palpation:  without tenderness of spinal processes   Lumbar spine palpation: with tenderness of lumbar area; without tightness of lumbar muscles    Range of Motion:   Lumbar flexion, forward flexion is 25 degrees with pain or tenderness    Lumbar extension is 5 with pain or tenderness   Left lateral bend is Normal  without pain or tenderness   Right lateral bend is Normal without pain or tenderness   Straight leg raising is Normal   Strength & tone: Normal   Stability overall normal stability   The patient has been educated about the nature of the problem(s) and counseled on treatment options.  The patient appeared to understand what I have discussed and is in agreement with it.  PLAN Call if any problems.  Precautions discussed.  Continue current medications.   Return to clinic 3 months

## 2015-09-10 NOTE — Patient Instructions (Signed)
Continue exercises and medicine

## 2015-09-16 ENCOUNTER — Ambulatory Visit (HOSPITAL_COMMUNITY): Payer: Self-pay | Admitting: Psychology

## 2015-09-18 ENCOUNTER — Ambulatory Visit (INDEPENDENT_AMBULATORY_CARE_PROVIDER_SITE_OTHER): Payer: Medicaid Other | Admitting: Psychology

## 2015-09-18 ENCOUNTER — Encounter (HOSPITAL_COMMUNITY): Payer: Self-pay | Admitting: Psychology

## 2015-09-18 DIAGNOSIS — F411 Generalized anxiety disorder: Secondary | ICD-10-CM

## 2015-09-18 DIAGNOSIS — F329 Major depressive disorder, single episode, unspecified: Secondary | ICD-10-CM

## 2015-09-18 DIAGNOSIS — F4310 Post-traumatic stress disorder, unspecified: Secondary | ICD-10-CM

## 2015-09-18 DIAGNOSIS — F32A Depression, unspecified: Secondary | ICD-10-CM
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
## 2015-10-06 ENCOUNTER — Telehealth: Payer: Self-pay | Admitting: Orthopedic Surgery

## 2015-10-06 NOTE — Telephone Encounter (Signed)
Hydrocodone-Acetaminophen (Norco) 7.5-325 mgs.  Qty 120    Sig: Take 1 tablet by mouth every 4 (four) hours as needed for moderate pain (Must last 30 days.  Do not drive or operate machinery while taking this medicine.). °

## 2015-10-07 ENCOUNTER — Other Ambulatory Visit: Payer: Self-pay | Admitting: *Deleted

## 2015-10-07 MED ORDER — HYDROCODONE-ACETAMINOPHEN 7.5-325 MG PO TABS: 1.0000 | ORAL_TABLET | ORAL | Status: DC | PRN

## 2015-10-16 ENCOUNTER — Ambulatory Visit (HOSPITAL_COMMUNITY): Payer: Self-pay | Admitting: Psychology

## 2015-10-28 ENCOUNTER — Ambulatory Visit (HOSPITAL_COMMUNITY): Payer: Self-pay | Admitting: Psychology

## 2015-11-04 ENCOUNTER — Telehealth: Payer: Self-pay | Admitting: Orthopaedic Surgery

## 2015-11-04 ENCOUNTER — Telehealth: Payer: Self-pay

## 2015-11-04 MED ORDER — HYDROCODONE-ACETAMINOPHEN 7.5-325 MG PO TABS: 1.0000 | ORAL_TABLET | ORAL | Status: DC | PRN

## 2015-11-04 NOTE — Telephone Encounter (Signed)
Rx done. 

## 2015-11-04 NOTE — Telephone Encounter (Signed)
Hydrocodone-Acetaminophen 7.5/325mg Qty 120 Tablets °

## 2015-11-04 NOTE — Telephone Encounter (Signed)
Referral attached accordingly.

## 2015-11-04 NOTE — Telephone Encounter (Signed)
Tammy at [facility] gave approval for patient to be seen as needed NPI [id number].

## 2015-11-05 ENCOUNTER — Other Ambulatory Visit (HOSPITAL_COMMUNITY): Payer: Self-pay | Admitting: Psychiatry

## 2015-11-09 ENCOUNTER — Encounter (HOSPITAL_COMMUNITY): Payer: Self-pay | Admitting: Psychology

## 2015-11-09 ENCOUNTER — Ambulatory Visit (INDEPENDENT_AMBULATORY_CARE_PROVIDER_SITE_OTHER): Payer: Medicaid Other | Admitting: Psychology

## 2015-11-09 DIAGNOSIS — F329 Major depressive disorder, single episode, unspecified: Secondary | ICD-10-CM

## 2015-11-09 DIAGNOSIS — F32A Depression, unspecified: Secondary | ICD-10-CM

## 2015-11-09 DIAGNOSIS — F411 Generalized anxiety disorder: Secondary | ICD-10-CM

## 2015-11-09 DIAGNOSIS — F4310 Post-traumatic stress disorder, unspecified: Secondary | ICD-10-CM
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
## 2015-11-18 ENCOUNTER — Ambulatory Visit (INDEPENDENT_AMBULATORY_CARE_PROVIDER_SITE_OTHER): Payer: Medicaid Other | Admitting: Psychiatry

## 2015-11-18 ENCOUNTER — Encounter (HOSPITAL_COMMUNITY): Payer: Self-pay | Admitting: Psychiatry

## 2015-11-18 VITALS — BP 126/86 | HR 84 | Ht 69.0 in | Wt 189.0 lb

## 2015-11-18 DIAGNOSIS — F4310 Post-traumatic stress disorder, unspecified: Secondary | ICD-10-CM | POA: Diagnosis not present

## 2015-11-18 DIAGNOSIS — F411 Generalized anxiety disorder: Secondary | ICD-10-CM | POA: Diagnosis not present

## 2015-11-18 MED ORDER — FLUOXETINE HCL 40 MG PO CAPS: 40.0000 mg | ORAL_CAPSULE | Freq: Every day | ORAL | Status: DC

## 2015-11-18 MED ORDER — ALPRAZOLAM 1 MG PO TABS: 1.0000 mg | ORAL_TABLET | Freq: Four times a day (QID) | ORAL | Status: DC | PRN

## 2015-11-18 MED ORDER — LAMOTRIGINE 100 MG PO TABS: 100.0000 mg | ORAL_TABLET | Freq: Two times a day (BID) | ORAL | Status: DC
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
## 2015-11-25 ENCOUNTER — Telehealth: Payer: Self-pay | Admitting: Orthopaedic Surgery

## 2015-11-25 NOTE — Telephone Encounter (Signed)
REFILL REQUEST FOR TIZANIDINE 4MG 

## 2015-11-25 NOTE — Telephone Encounter (Signed)
Rx done. 

## 2015-11-26 ENCOUNTER — Telehealth (HOSPITAL_COMMUNITY): Payer: Self-pay | Admitting: *Deleted

## 2015-12-08 ENCOUNTER — Ambulatory Visit (INDEPENDENT_AMBULATORY_CARE_PROVIDER_SITE_OTHER): Payer: Medicaid Other

## 2015-12-08 ENCOUNTER — Encounter: Payer: Self-pay | Admitting: Orthopaedic Surgery

## 2015-12-08 ENCOUNTER — Ambulatory Visit (INDEPENDENT_AMBULATORY_CARE_PROVIDER_SITE_OTHER): Payer: Medicaid Other | Admitting: Orthopaedic Surgery

## 2015-12-08 ENCOUNTER — Ambulatory Visit: Payer: Medicaid Other

## 2015-12-08 VITALS — BP 157/108 | HR 78 | Temp 98.6°F | Ht 69.0 in | Wt 195.0 lb

## 2015-12-08 DIAGNOSIS — M25512 Pain in left shoulder: Secondary | ICD-10-CM

## 2015-12-08 DIAGNOSIS — F4310 Post-traumatic stress disorder, unspecified: Secondary | ICD-10-CM | POA: Diagnosis not present

## 2015-12-08 DIAGNOSIS — M545 Low back pain, unspecified: Secondary | ICD-10-CM

## 2015-12-08 MED ORDER — HYDROCODONE-ACETAMINOPHEN 7.5-325 MG PO TABS: 1.0000 | ORAL_TABLET | ORAL | Status: DC | PRN

## 2015-12-08 NOTE — Progress Notes (Signed)
Patient ZO:XWRUE:Cody Walls, male DOB:[birth year], 45 y.o. AVW:[id number]  Chief Complaint  Patient presents with  . Follow-up    low back and left arm pain    HPI  Cody Walls is a 45 y.o. male who has chronic lower back pain.  His back pain is about the same.  He has no paresthesias, no bowel or bladder problem.  He has been doing his exercises and taking his medicine.  He has developed however, left shoulder pain with popping.   Shoulder Pain  The pain is present in the left shoulder. This is a new problem. The current episode started more than 1 month ago. There has been no history of extremity trauma. The problem occurs daily. The problem has been gradually worsening. The quality of the pain is described as aching. The pain is at a severity of 3/10. The pain is mild. The symptoms are aggravated by activity. He has tried movement, oral narcotics, rest and heat for the symptoms. The treatment provided moderate relief.   He has no trauma to the shoulder or paresthesias.    Body mass index is 28.78 kg/(m^2).  Review of Systems  HENT: Negative for congestion.   Respiratory: Negative for cough and shortness of breath.   Cardiovascular: Negative for chest pain and leg swelling.  Endocrine: Negative for cold intolerance.  Genitourinary:       History of kidney stone   Musculoskeletal: Positive for myalgias, back pain and arthralgias.  Allergic/Immunologic: Negative for environmental allergies.  Psychiatric/Behavioral: The patient is nervous/anxious.   All other systems reviewed and are negative.   Past Medical History  Diagnosis Date  . Kidney stones   . Anxiety   . Blood poisoning (HCC)   . Bursitis   . Tendonitis   . Chronic back pain   . PTSD (post-traumatic stress disorder)   . Torn rotator cuff     Past Surgical History  Procedure Laterality Date  . Kidney stone surgery      Family History  Problem Relation Age of Onset  . Heart failure Other   . Alcohol abuse  Father     Social History Social History  Substance Use Topics  . Smoking status: Current Every Day Smoker -- 1.50 packs/day for 28 years    Types: Cigarettes  . Smokeless tobacco: Never Used  . Alcohol Use: 1.8 oz/week    3 Cans of beer per week     Comment: occasional    No Known Allergies  Current Outpatient Prescriptions  Medication Sig Dispense Refill  . ALPRAZolam (XANAX) 1 MG tablet Take 1 tablet (1 mg total) by mouth 4 (four) times daily as needed for sleep or anxiety. 120 tablet 2  . aspirin EC 81 MG tablet Take 81 mg by mouth daily.    Marland Kitchen. FLUoxetine (PROZAC) 40 MG capsule Take 1 capsule (40 mg total) by mouth daily. 30 capsule 2  . HYDROcodone-acetaminophen (NORCO) 7.5-325 MG tablet Take 1 tablet by mouth every 4 (four) hours as needed for moderate pain (Must last 30 days.  Do not drive or operate machinery while taking this medicine.). 120 tablet 0  . ibuprofen (ADVIL,MOTRIN) 200 MG tablet Take 200 mg by mouth every 6 (six) hours as needed for pain.    Marland Kitchen. lamoTRIgine (LAMICTAL) 100 MG tablet Take 1 tablet (100 mg total) by mouth 2 (two) times daily. 60 tablet 2  . omeprazole (PRILOSEC) 40 MG capsule Take 40 mg by mouth as needed (acid reflux).     .Marland Kitchen  tiZANidine (ZANAFLEX) 4 MG tablet Take 4 mg by mouth every 6 (six) hours as needed for muscle spasms.    Marland Kitchen tiZANidine (ZANAFLEX) 4 MG tablet TAKE 1 TABLET BY MOUTH EVERY 8 HOURS AS NEEDED FOR SPASMS. 90 tablet 2   No current facility-administered medications for this visit.     Physical Exam  Blood pressure 157/108, pulse 78, temperature 98.6 F (37 C), height  (1.753 m), weight 195 lb (88.451 kg).  Constitutional: overall normal hygiene, normal nutrition, well developed, normal grooming, normal body habitus. Assistive device:none  Musculoskeletal: gait and station Limp none, muscle tone and strength are normal, no tremors or atrophy is present.  .  Neurological: coordination overall normal.  Deep tendon  reflex/nerve stretch intact.  Sensation normal.  Cranial nerves II-XII intact.   Skin:   normal overall no scars, lesions, ulcers or rashes. No psoriasis.  Psychiatric: Alert and oriented x 3.  Recent memory intact, remote memory unclear.  Normal mood and affect. Well groomed.  Good eye contact.  Cardiovascular: overall no swelling, no varicosities, no edema bilaterally, normal temperatures of the legs and arms, no clubbing, cyanosis and good capillary refill.  Lymphatic: palpation is normal.  Examination of left Upper Extremity is done.  Inspection:   Overall:  Elbow non-tender without crepitus or defects, forearm non-tender without crepitus or defects, wrist non-tender without crepitus or defects, hand non-tender.    Shoulder: with glenohumeral joint tenderness, without effusion.   Upper arm: without swelling and tenderness   Range of motion:   Overall:  Full range of motion of the elbow, full range of motion of wrist and full range of motion in fingers.   Shoulder:  left  160 degrees forward flexion; 145 degrees abduction; 35 degrees internal rotation, 35 degrees external rotation, 20 degrees extension, 40 degrees adduction.   Stability:   Overall:  Shoulder, elbow and wrist stable   Strength and Tone:   Overall full shoulder muscles strength, full upper arm strength and normal upper arm bulk and tone.  Right shoulder is normal.  Spine/Pelvis examination:  Inspection:  Overall, sacoiliac joint benign and hips nontender; without crepitus or defects.   Thoracic spine inspection: Alignment normal without kyphosis present   Lumbar spine inspection:  Alignment  with normal lumbar lordosis, without scoliosis apparent.   Thoracic spine palpation:  without tenderness of spinal processes   Lumbar spine palpation: with tenderness of lumbar area; without tightness of lumbar muscles    Range of Motion:   Lumbar flexion, forward flexion is 35  without pain or tenderness    Lumbar  extension is 10  without pain or tenderness   Left lateral bend is Normal  without pain or tenderness   Right lateral bend is Normal without pain or tenderness   Straight leg raising is Normal   Strength & tone: Normal   Stability overall normal stability  X-rays were done of the left shoulder, reported separately.  The patient has been educated about the nature of the problem(s) and counseled on treatment options.  The patient appeared to understand what I have discussed and is in agreement with it.  Encounter Diagnoses  Name Primary?  . Left shoulder pain Yes  . Midline low back pain without sciatica   . PTSD (post-traumatic stress disorder)     PLAN Call if any problems.  Precautions discussed.  Continue current medications.   Return to clinic 3 months

## 2015-12-10 ENCOUNTER — Ambulatory Visit (HOSPITAL_COMMUNITY): Payer: Self-pay | Admitting: Psychology

## 2016-01-04 ENCOUNTER — Telehealth: Payer: Self-pay | Admitting: Radiology

## 2016-01-04 MED ORDER — HYDROCODONE-ACETAMINOPHEN 7.5-325 MG PO TABS: 1.0000 | ORAL_TABLET | ORAL | Status: DC | PRN

## 2016-01-04 NOTE — Telephone Encounter (Signed)
Rx done. 

## 2016-01-04 NOTE — Telephone Encounter (Signed)
Hydrocodone 7.5/325mg  # 120

## 2016-02-02 ENCOUNTER — Telehealth: Payer: Self-pay | Admitting: Orthopaedic Surgery

## 2016-02-02 MED ORDER — HYDROCODONE-ACETAMINOPHEN 7.5-325 MG PO TABS: 1.0000 | ORAL_TABLET | ORAL | Status: DC | PRN

## 2016-02-02 NOTE — Telephone Encounter (Signed)
Patient called and requested a refill on Hydrocodone-Acetaminophen (Norco)  7.5-325 mgs. Qty 120 Sig: Take 1 tablet by mouth every 4 (four) hours as needed for moderate pain (Must last 30 days.  Do not drive or operate machinery while taking this medicine.). °

## 2016-02-02 NOTE — Telephone Encounter (Signed)
Rx done. 

## 2016-02-18 ENCOUNTER — Encounter (HOSPITAL_COMMUNITY): Payer: Self-pay | Admitting: Psychiatry

## 2016-02-18 ENCOUNTER — Ambulatory Visit (INDEPENDENT_AMBULATORY_CARE_PROVIDER_SITE_OTHER): Payer: Medicaid Other | Admitting: Psychiatry

## 2016-02-18 VITALS — BP 154/92 | HR 72 | Ht 69.0 in | Wt 189.2 lb

## 2016-02-18 DIAGNOSIS — F329 Major depressive disorder, single episode, unspecified: Secondary | ICD-10-CM

## 2016-02-18 DIAGNOSIS — F4310 Post-traumatic stress disorder, unspecified: Secondary | ICD-10-CM

## 2016-02-18 DIAGNOSIS — F32A Depression, unspecified: Secondary | ICD-10-CM

## 2016-02-18 MED ORDER — FLUOXETINE HCL 40 MG PO CAPS: 40.0000 mg | ORAL_CAPSULE | Freq: Every day | ORAL | 2 refills | Status: DC

## 2016-02-18 MED ORDER — LAMOTRIGINE 100 MG PO TABS: 100.0000 mg | ORAL_TABLET | Freq: Two times a day (BID) | ORAL | 2 refills | Status: DC

## 2016-02-18 MED ORDER — ALPRAZOLAM 1 MG PO TABS: 1.0000 mg | ORAL_TABLET | Freq: Four times a day (QID) | ORAL | 2 refills | Status: DC | PRN
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
## 2016-02-24 ENCOUNTER — Ambulatory Visit (HOSPITAL_COMMUNITY): Payer: Medicaid Other | Admitting: Psychology

## 2016-03-07 ENCOUNTER — Encounter: Payer: Self-pay | Admitting: Orthopaedic Surgery

## 2016-03-08 ENCOUNTER — Ambulatory Visit (INDEPENDENT_AMBULATORY_CARE_PROVIDER_SITE_OTHER): Payer: Medicaid Other | Admitting: Orthopaedic Surgery

## 2016-03-08 ENCOUNTER — Encounter: Payer: Self-pay | Admitting: Orthopaedic Surgery

## 2016-03-08 VITALS — BP 156/101 | HR 84 | Temp 97.9°F | Ht 70.0 in | Wt 191.0 lb

## 2016-03-08 DIAGNOSIS — M25512 Pain in left shoulder: Secondary | ICD-10-CM

## 2016-03-08 DIAGNOSIS — M545 Low back pain, unspecified: Secondary | ICD-10-CM

## 2016-03-08 DIAGNOSIS — F4310 Post-traumatic stress disorder, unspecified: Secondary | ICD-10-CM

## 2016-03-08 MED ORDER — HYDROCODONE-ACETAMINOPHEN 7.5-325 MG PO TABS: 1.0000 | ORAL_TABLET | ORAL | 0 refills | Status: DC | PRN

## 2016-03-08 NOTE — Progress Notes (Signed)
Patient ON:GEXBM:Cody Walls, male DOB:05/16/1971, 45 y.o. WUX:[id number]  Chief Complaint  Patient presents with  . Follow-up    Right Shoulder Pain    HPI  Cody RosebushFloyd C Walls is a 45 y.o. male who has chronic lower back pain with no radiation.  He is doing his exercises, he is taking his medicine.  He has no bowel or bladder problems.  He is stable. HPI  Body mass index is 27.41 kg/m.  ROS  Review of Systems  HENT: Negative for congestion.   Respiratory: Negative for cough and shortness of breath.   Cardiovascular: Negative for chest pain and leg swelling.  Endocrine: Negative for cold intolerance.  Genitourinary:       History of kidney stone   Musculoskeletal: Positive for arthralgias, back pain and myalgias.  Allergic/Immunologic: Negative for environmental allergies.  Psychiatric/Behavioral: The patient is nervous/anxious.   All other systems reviewed and are negative.   Past Medical History:  Diagnosis Date  . Anxiety   . Blood poisoning (HCC)   . Bursitis   . Chronic back pain   . Kidney stones   . PTSD (post-traumatic stress disorder)   . Tendonitis   . Torn rotator cuff     Past Surgical History:  Procedure Laterality Date  . KIDNEY STONE SURGERY      Family History  Problem Relation Age of Onset  . Heart failure Other   . Alcohol abuse Father     Social History Social History  Substance Use Topics  . Smoking status: Current Every Day Smoker    Packs/day: 1.50    Years: 28.00    Types: Cigarettes  . Smokeless tobacco: Never Used  . Alcohol use 1.8 oz/week    3 Cans of beer per week     Comment: occasional    No Known Allergies  Current Outpatient Prescriptions  Medication Sig Dispense Refill  . ALPRAZolam (XANAX) 1 MG tablet Take 1 tablet (1 mg total) by mouth 4 (four) times daily as needed for sleep or anxiety. 120 tablet 2  . aspirin EC 81 MG tablet Take 81 mg by mouth daily.    Marland Kitchen. FLUoxetine (PROZAC) 40 MG capsule Take 1 capsule (40 mg total)  by mouth daily. 30 capsule 2  . HYDROcodone-acetaminophen (NORCO) 7.5-325 MG tablet Take 1 tablet by mouth every 4 (four) hours as needed for moderate pain (Must last 30 days.  Do not drive or operate machinery while taking this medicine.). 120 tablet 0  . ibuprofen (ADVIL,MOTRIN) 200 MG tablet Take 200 mg by mouth every 6 (six) hours as needed for pain.    Marland Kitchen. lamoTRIgine (LAMICTAL) 100 MG tablet Take 1 tablet (100 mg total) by mouth 2 (two) times daily. 60 tablet 2  . omeprazole (PRILOSEC) 40 MG capsule Take 40 mg by mouth as needed (acid reflux).     Marland Kitchen. tiZANidine (ZANAFLEX) 4 MG tablet Take 4 mg by mouth every 6 (six) hours as needed for muscle spasms.     No current facility-administered medications for this visit.      Physical Exam  Blood pressure (!) 156/101, pulse 84, temperature 97.9 F (36.6 C), height 5\' 10"  (1.778 m), weight 191 lb (86.6 kg).  Constitutional: overall normal hygiene, normal nutrition, well developed, normal grooming, normal body habitus. Assistive device:none  Musculoskeletal: gait and station Limp none, muscle tone and strength are normal, no tremors or atrophy is present.  .  Neurological: coordination overall normal.  Deep tendon reflex/nerve stretch intact.  Sensation  normal.  Cranial nerves II-XII intact.   Skin:   normal overall no scars, lesions, ulcers or rashes. No psoriasis.  Psychiatric: Alert and oriented x 3.  Recent memory intact, remote memory unclear.  Normal mood and affect. Well groomed.  Good eye contact.  Cardiovascular: overall no swelling, no varicosities, no edema bilaterally, normal temperatures of the legs and arms, no clubbing, cyanosis and good capillary refill.  Lymphatic: palpation is normal.  Spine/Pelvis examination:  Inspection:  Overall, sacoiliac joint benign and hips nontender; without crepitus or defects.   Thoracic spine inspection: Alignment normal without kyphosis present   Lumbar spine inspection:  Alignment  with  normal lumbar lordosis, without scoliosis apparent.   Thoracic spine palpation:  without tenderness of spinal processes   Lumbar spine palpation: with tenderness of lumbar area; without tightness of lumbar muscles    Range of Motion:   Lumbar flexion, forward flexion is 35 without pain or tenderness    Lumbar extension is 5 without pain or tenderness   Left lateral bend is Normal  without pain or tenderness   Right lateral bend is Normal without pain or tenderness   Straight leg raising is Normal   Strength & tone: Normal   Stability overall normal stability   I have told him about Medicaid limiting new Rx for pain to 14 days.  He understands.  This is effective 03-20-16.  The patient has been educated about the nature of the problem(s) and counseled on treatment options.  The patient appeared to understand what I have discussed and is in agreement with it.  Encounter Diagnoses  Name Primary?  . Left shoulder pain Yes  . Midline low back pain without sciatica   . PTSD (post-traumatic stress disorder)     PLAN Call if any problems.  Precautions discussed.  Continue current medications.   Return to clinic 3 months  Electronically Signed Darreld McleanWayne Ausencio Vaden, MD 8/15/201711:00 AM

## 2016-03-14 ENCOUNTER — Telehealth (HOSPITAL_COMMUNITY): Payer: Self-pay | Admitting: *Deleted

## 2016-03-14 ENCOUNTER — Encounter (HOSPITAL_COMMUNITY): Payer: Self-pay | Admitting: Psychology

## 2016-03-14 NOTE — Telephone Encounter (Signed)
phone call regarding cancelled appointment mailbox full.

## 2016-03-29 ENCOUNTER — Ambulatory Visit (HOSPITAL_COMMUNITY): Payer: Self-pay | Admitting: Psychology

## 2016-04-06 ENCOUNTER — Telehealth: Payer: Self-pay | Admitting: Orthopaedic Surgery

## 2016-04-06 MED ORDER — HYDROCODONE-ACETAMINOPHEN 7.5-325 MG PO TABS: ORAL_TABLET | ORAL | 0 refills | Status: DC

## 2016-04-06 NOTE — Telephone Encounter (Signed)
Hydrocodone-Acetaminophen 7.5/325mg     Qty 120 Tablets    Patient is Medicaid

## 2016-04-07 ENCOUNTER — Telehealth: Payer: Self-pay | Admitting: Orthopaedic Surgery

## 2016-04-07 ENCOUNTER — Encounter: Payer: Self-pay | Admitting: Orthopaedic Surgery

## 2016-04-07 MED ORDER — TIZANIDINE HCL 4 MG PO TABS: 4.0000 mg | ORAL_TABLET | Freq: Three times a day (TID) | ORAL | 0 refills | Status: DC | PRN

## 2016-04-07 NOTE — Telephone Encounter (Signed)
Tizanidine (Zanaflex) 4 mg    Qty 90 Tablets

## 2016-04-18 ENCOUNTER — Encounter: Payer: Self-pay | Admitting: Orthopaedic Surgery

## 2016-04-20 ENCOUNTER — Telehealth: Payer: Self-pay | Admitting: Orthopaedic Surgery

## 2016-04-20 NOTE — Telephone Encounter (Signed)
Patient requests refill: °HYDROcodone-acetaminophen (NORCO) 7.5-325 MG tablet 50 tablet  °- insurance Medicaid. ° °

## 2016-04-21 MED ORDER — HYDROCODONE-ACETAMINOPHEN 7.5-325 MG PO TABS: ORAL_TABLET | ORAL | 0 refills | Status: DC

## 2016-05-03 ENCOUNTER — Telehealth: Payer: Self-pay | Admitting: Orthopaedic Surgery

## 2016-05-03 MED ORDER — HYDROCODONE-ACETAMINOPHEN 7.5-325 MG PO TABS: ORAL_TABLET | ORAL | 0 refills | Status: DC

## 2016-05-03 NOTE — Telephone Encounter (Signed)
(  Re-entry of telephone note 05/03/16, signed encounter prior to "sending and closing")  - Patient requests refill:  HYDROcodone-acetaminophen (NORCO) 7.5-325 MG tablet 45 tablet   - insurance:  Medicaid

## 2016-05-03 NOTE — Addendum Note (Signed)
Addended by: Earnstine RegalKEELING, JOHN W on: 05/03/2016 02:20 PM   Modules accepted: Orders

## 2016-05-03 NOTE — Telephone Encounter (Signed)
Patient called for refill:  HYDROcodone-acetaminophen (NORCO) 7.5-325 MG tablet 45 tablet   - insurance: Medicaid

## 2016-05-04 ENCOUNTER — Ambulatory Visit (INDEPENDENT_AMBULATORY_CARE_PROVIDER_SITE_OTHER): Payer: Medicaid Other | Admitting: Psychology

## 2016-05-04 ENCOUNTER — Encounter (HOSPITAL_COMMUNITY): Payer: Self-pay | Admitting: Psychology

## 2016-05-04 DIAGNOSIS — F331 Major depressive disorder, recurrent, moderate: Secondary | ICD-10-CM

## 2016-05-04 DIAGNOSIS — F4310 Post-traumatic stress disorder, unspecified: Secondary | ICD-10-CM

## 2016-05-04 DIAGNOSIS — F411 Generalized anxiety disorder: Secondary | ICD-10-CM
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
## 2016-05-17 ENCOUNTER — Telehealth: Payer: Self-pay | Admitting: Orthopaedic Surgery

## 2016-05-17 MED ORDER — HYDROCODONE-ACETAMINOPHEN 7.5-325 MG PO TABS: ORAL_TABLET | ORAL | 0 refills | Status: DC

## 2016-05-17 NOTE — Telephone Encounter (Signed)
Patient called for refill  °HYDROcodone-acetaminophen (NORCO) 7.5-325 MG tablet 40 tablet  ° - insurance: Medicaid ° °

## 2016-05-20 ENCOUNTER — Ambulatory Visit (INDEPENDENT_AMBULATORY_CARE_PROVIDER_SITE_OTHER): Payer: Medicaid Other | Admitting: Psychiatry

## 2016-05-20 ENCOUNTER — Encounter (HOSPITAL_COMMUNITY): Payer: Self-pay | Admitting: Psychiatry

## 2016-05-20 VITALS — BP 168/112 | HR 78 | Ht 70.0 in | Wt 194.4 lb

## 2016-05-20 DIAGNOSIS — Z811 Family history of alcohol abuse and dependence: Secondary | ICD-10-CM | POA: Diagnosis not present

## 2016-05-20 DIAGNOSIS — F4310 Post-traumatic stress disorder, unspecified: Secondary | ICD-10-CM | POA: Diagnosis not present

## 2016-05-20 DIAGNOSIS — Z8249 Family history of ischemic heart disease and other diseases of the circulatory system: Secondary | ICD-10-CM

## 2016-05-20 DIAGNOSIS — Z79899 Other long term (current) drug therapy: Secondary | ICD-10-CM

## 2016-05-20 MED ORDER — FLUOXETINE HCL 40 MG PO CAPS: 40.0000 mg | ORAL_CAPSULE | Freq: Every day | ORAL | 2 refills | Status: DC

## 2016-05-20 MED ORDER — ALPRAZOLAM 1 MG PO TABS: 1.0000 mg | ORAL_TABLET | Freq: Four times a day (QID) | ORAL | 2 refills | Status: DC | PRN

## 2016-05-20 MED ORDER — LAMOTRIGINE 100 MG PO TABS: 100.0000 mg | ORAL_TABLET | Freq: Two times a day (BID) | ORAL | 2 refills | Status: DC
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
## 2016-06-02 ENCOUNTER — Ambulatory Visit (INDEPENDENT_AMBULATORY_CARE_PROVIDER_SITE_OTHER): Payer: Medicaid Other | Admitting: Orthopaedic Surgery

## 2016-06-02 ENCOUNTER — Encounter: Payer: Self-pay | Admitting: Orthopaedic Surgery

## 2016-06-02 VITALS — BP 166/107 | HR 76 | Temp 97.3°F | Ht 70.0 in | Wt 198.0 lb

## 2016-06-02 DIAGNOSIS — F17210 Nicotine dependence, cigarettes, uncomplicated: Secondary | ICD-10-CM

## 2016-06-02 DIAGNOSIS — F4310 Post-traumatic stress disorder, unspecified: Secondary | ICD-10-CM

## 2016-06-02 DIAGNOSIS — G8929 Other chronic pain: Secondary | ICD-10-CM

## 2016-06-02 DIAGNOSIS — M25512 Pain in left shoulder: Secondary | ICD-10-CM

## 2016-06-02 DIAGNOSIS — M545 Low back pain: Secondary | ICD-10-CM | POA: Diagnosis not present

## 2016-06-02 MED ORDER — HYDROCODONE-ACETAMINOPHEN 7.5-325 MG PO TABS: ORAL_TABLET | ORAL | 0 refills | Status: DC

## 2016-06-02 MED ORDER — TIZANIDINE HCL 4 MG PO TABS: 4.0000 mg | ORAL_TABLET | Freq: Three times a day (TID) | ORAL | 0 refills | Status: DC | PRN

## 2016-06-02 NOTE — Progress Notes (Addendum)
Patient ZO:XWRUE:Cody Walls, male DOB:11/13/1970, 45 y.o. AVW:[id number]  Chief Complaint  Patient presents with  . Follow-up    chronic back pain    HPI  Cody RosebushFloyd C Walls is a 45 y.o. male who has chronic lower back pain without sciatica.  He is stable.  He is taking his medicine and doing his exercises.  He has no new acute episodes.  The cooler weather has made his pain worse.  He continues to smoke and is trying to cut back but has not.  He will try more. HPI  Body mass index is 28.41 kg/m.  ROS  Review of Systems  HENT: Negative for congestion.   Respiratory: Negative for cough and shortness of breath.   Cardiovascular: Negative for chest pain and leg swelling.  Endocrine: Negative for cold intolerance.  Genitourinary:       History of kidney stone   Musculoskeletal: Positive for arthralgias, back pain and myalgias.  Allergic/Immunologic: Negative for environmental allergies.  Psychiatric/Behavioral: The patient is nervous/anxious.   All other systems reviewed and are negative.   Past Medical History:  Diagnosis Date  . Anxiety   . Blood poisoning (HCC)   . Bursitis   . Chronic back pain   . Kidney stones   . PTSD (post-traumatic stress disorder)   . Tendonitis   . Torn rotator cuff     Past Surgical History:  Procedure Laterality Date  . KIDNEY STONE SURGERY      Family History  Problem Relation Age of Onset  . Heart failure Other   . Alcohol abuse Father     Social History Social History  Substance Use Topics  . Smoking status: Current Every Day Smoker    Packs/day: 1.50    Years: 28.00    Types: Cigarettes  . Smokeless tobacco: Never Used  . Alcohol use 1.8 oz/week    3 Cans of beer per week     Comment: occasional    No Known Allergies  Current Outpatient Prescriptions  Medication Sig Dispense Refill  . ALPRAZolam (XANAX) 1 MG tablet Take 1 tablet (1 mg total) by mouth 4 (four) times daily as needed for sleep or anxiety. 120 tablet 2  .  aspirin EC 81 MG tablet Take 81 mg by mouth daily.    Marland Kitchen. FLUoxetine (PROZAC) 40 MG capsule Take 1 capsule (40 mg total) by mouth daily. 30 capsule 2  . HYDROcodone-acetaminophen (NORCO) 7.5-325 MG tablet One every six hours for pain as needed.  Do not drive car or operate machinery while taking this medicine.  Must last 14 days. 30 tablet 0  . ibuprofen (ADVIL,MOTRIN) 200 MG tablet Take 200 mg by mouth every 6 (six) hours as needed for pain.    Marland Kitchen. lamoTRIgine (LAMICTAL) 100 MG tablet Take 1 tablet (100 mg total) by mouth 2 (two) times daily. 60 tablet 2  . omeprazole (PRILOSEC) 40 MG capsule Take 40 mg by mouth as needed (acid reflux).     Marland Kitchen. tiZANidine (ZANAFLEX) 4 MG tablet Take 1 tablet (4 mg total) by mouth every 8 (eight) hours as needed for muscle spasms. 30 tablet 0   No current facility-administered medications for this visit.      Physical Exam  Blood pressure (!) 166/107, pulse 76, temperature 97.3 F (36.3 C), height 5\' 10"  (1.778 m), weight 198 lb (89.8 kg).  Constitutional: overall normal hygiene, normal nutrition, well developed, normal grooming, normal body habitus. Assistive device:none  Musculoskeletal: gait and station Limp none, muscle tone  and strength are normal, no tremors or atrophy is present.  .  Neurological: coordination overall normal.  Deep tendon reflex/nerve stretch intact.  Sensation normal.  Cranial nerves II-XII intact.   Skin:   Normal overall no scars, lesions, ulcers or rashes. No psoriasis.  Psychiatric: Alert and oriented x 3.  Recent memory intact, remote memory unclear.  Normal mood and affect. Well groomed.  Good eye contact.  Cardiovascular: overall no swelling, no varicosities, no edema bilaterally, normal temperatures of the legs and arms, no clubbing, cyanosis and good capillary refill.  Lymphatic: palpation is normal.  Spine/Pelvis examination:  Inspection:  Overall, sacoiliac joint benign and hips nontender; without crepitus or  defects.   Thoracic spine inspection: Alignment normal without kyphosis present   Lumbar spine inspection:  Alignment  with normal lumbar lordosis, without scoliosis apparent.   Thoracic spine palpation:  without tenderness of spinal processes   Lumbar spine palpation: with tenderness of lumbar area; without tightness of lumbar muscles    Range of Motion:   Lumbar flexion, forward flexion is 45 without pain or tenderness    Lumbar extension is 10 without pain or tenderness   Left lateral bend is Normal  without pain or tenderness   Right lateral bend is Normal without pain or tenderness   Straight leg raising is Normal   Strength & tone: Normal   Stability overall normal stability     The patient has been educated about the nature of the problem(s) and counseled on treatment options.  The patient appeared to understand what I have discussed and is in agreement with it.  Encounter Diagnoses  Name Primary?  . Chronic left shoulder pain Yes  . Chronic midline low back pain without sciatica   . PTSD (post-traumatic stress disorder)   . Cigarette nicotine dependence without complication     PLAN Call if any problems.  Precautions discussed.  Continue current medications.   Return to clinic 3 months   Electronically Signed Darreld McleanWayne Aneka Fagerstrom, MD 11/9/20178:46 AM

## 2016-06-02 NOTE — Patient Instructions (Signed)
Smoking Cessation, Tips for Success If you are ready to quit smoking, congratulations! You have chosen to help yourself be healthier. Cigarettes bring nicotine, tar, carbon monoxide, and other irritants into your body. Your lungs, heart, and blood vessels will be able to work better without these poisons. There are many different ways to quit smoking. Nicotine gum, nicotine patches, a nicotine inhaler, or nicotine nasal spray can help with physical craving. Hypnosis, support groups, and medicines help break the habit of smoking. WHAT THINGS CAN I DO TO MAKE QUITTING EASIER?  Here are some tips to help you quit for good:  Pick a date when you will quit smoking completely. Tell all of your friends and family about your plan to quit on that date.  Do not try to slowly cut down on the number of cigarettes you are smoking. Pick a quit date and quit smoking completely starting on that day.  Throw away all cigarettes.   Clean and remove all ashtrays from your home, work, and car.  On a card, write down your reasons for quitting. Carry the card with you and read it when you get the urge to smoke.  Cleanse your body of nicotine. Drink enough water and fluids to keep your urine clear or pale yellow. Do this after quitting to flush the nicotine from your body.  Learn to predict your moods. Do not let a bad situation be your excuse to have a cigarette. Some situations in your life might tempt you into wanting a cigarette.  Never have "just one" cigarette. It leads to wanting another and another. Remind yourself of your decision to quit.  Change habits associated with smoking. If you smoked while driving or when feeling stressed, try other activities to replace smoking. Stand up when drinking your coffee. Brush your teeth after eating. Sit in a different chair when you read the paper. Avoid alcohol while trying to quit, and try to drink fewer caffeinated beverages. Alcohol and caffeine may urge you to  smoke.  Avoid foods and drinks that can trigger a desire to smoke, such as sugary or spicy foods and alcohol.  Ask people who smoke not to smoke around you.  Have something planned to do right after eating or having a cup of coffee. For example, plan to take a walk or exercise.  Try a relaxation exercise to calm you down and decrease your stress. Remember, you may be tense and nervous for the first 2 weeks after you quit, but this will pass.  Find new activities to keep your hands busy. Play with a pen, coin, or rubber band. Doodle or draw things on paper.  Brush your teeth right after eating. This will help cut down on the craving for the taste of tobacco after meals. You can also try mouthwash.   Use oral substitutes in place of cigarettes. Try using lemon drops, carrots, cinnamon sticks, or chewing gum. Keep them handy so they are available when you have the urge to smoke.  When you have the urge to smoke, try deep breathing.  Designate your home as a nonsmoking area.  If you are a heavy smoker, ask your health care provider about a prescription for nicotine chewing gum. It can ease your withdrawal from nicotine.  Reward yourself. Set aside the cigarette money you save and buy yourself something nice.  Look for support from others. Join a support group or smoking cessation program. Ask someone at home or at work to help you with your plan   to quit smoking.  Always ask yourself, "Do I need this cigarette or is this just a reflex?" Tell yourself, "Today, I choose not to smoke," or "I do not want to smoke." You are reminding yourself of your decision to quit.  Do not replace cigarette smoking with electronic cigarettes (commonly called e-cigarettes). The safety of e-cigarettes is unknown, and some may contain harmful chemicals.  If you relapse, do not give up! Plan ahead and think about what you will do the next time you get the urge to smoke. HOW WILL I FEEL WHEN I QUIT SMOKING? You  may have symptoms of withdrawal because your body is used to nicotine (the addictive substance in cigarettes). You may crave cigarettes, be irritable, feel very hungry, cough often, get headaches, or have difficulty concentrating. The withdrawal symptoms are only temporary. They are strongest when you first quit but will go away within 10-14 days. When withdrawal symptoms occur, stay in control. Think about your reasons for quitting. Remind yourself that these are signs that your body is healing and getting used to being without cigarettes. Remember that withdrawal symptoms are easier to treat than the major diseases that smoking can cause.  Even after the withdrawal is over, expect periodic urges to smoke. However, these cravings are generally short lived and will go away whether you smoke or not. Do not smoke! WHAT RESOURCES ARE AVAILABLE TO HELP ME QUIT SMOKING? Your health care provider can direct you to community resources or hospitals for support, which may include:  Group support.  Education.  Hypnosis.  Therapy.   This information is not intended to replace advice given to you by your health care provider. Make sure you discuss any questions you have with your health care provider.   Document Released: 04/08/2004 Document Revised: 08/01/2014 Document Reviewed: 12/27/2012 Elsevier Interactive Patient Education 2016 Elsevier Inc.  

## 2016-06-08 ENCOUNTER — Ambulatory Visit: Payer: Self-pay | Admitting: Orthopaedic Surgery

## 2016-06-13 ENCOUNTER — Telehealth: Payer: Self-pay | Admitting: Orthopaedic Surgery

## 2016-06-13 MED ORDER — HYDROCODONE-ACETAMINOPHEN 7.5-325 MG PO TABS: ORAL_TABLET | ORAL | 0 refills | Status: DC

## 2016-06-13 NOTE — Telephone Encounter (Signed)
Patient requests refill on Hydrocodone/Acetaminophen (Norco)  7.5-325  Mgs.  Qty  30  Sig: One every six hours for pain as needed. Do not drive car or operate machinery while taking this medicine. Must last 14 days.

## 2016-06-29 ENCOUNTER — Telehealth: Payer: Self-pay | Admitting: Orthopaedic Surgery

## 2016-06-29 MED ORDER — HYDROCODONE-ACETAMINOPHEN 7.5-325 MG PO TABS: ORAL_TABLET | ORAL | 0 refills | Status: DC

## 2016-06-29 NOTE — Telephone Encounter (Signed)
Patient requests refill: °HYDROcodone-acetaminophen (NORCO) 7.5-325 MG tablet 25 tablet  ° ° °

## 2016-07-11 ENCOUNTER — Telehealth: Payer: Self-pay | Admitting: Orthopaedic Surgery

## 2016-07-11 NOTE — Telephone Encounter (Signed)
Hydrocodone-Acetaminophen  7.5/325 mg  Qty 20 Tablets °

## 2016-07-11 NOTE — Telephone Encounter (Signed)
Tizanidine (Zanaflex) 4 mg  Qty 30 tablets

## 2016-07-12 MED ORDER — HYDROCODONE-ACETAMINOPHEN 7.5-325 MG PO TABS: ORAL_TABLET | ORAL | 0 refills | Status: DC

## 2016-07-12 NOTE — Telephone Encounter (Signed)
Already done earlier today

## 2016-07-12 NOTE — Telephone Encounter (Signed)
Denied.  Not recommended to stay on this.

## 2016-07-26 ENCOUNTER — Telehealth: Payer: Self-pay | Admitting: Orthopaedic Surgery

## 2016-07-26 NOTE — Telephone Encounter (Signed)
Hydrocodone-Acetaminophen  7.5/325 mg  Qty 20 Tablets °

## 2016-07-26 NOTE — Telephone Encounter (Signed)
No more narcotics.  Denied

## 2016-08-19 ENCOUNTER — Encounter (HOSPITAL_COMMUNITY): Payer: Self-pay | Admitting: Psychiatry

## 2016-08-19 ENCOUNTER — Ambulatory Visit (INDEPENDENT_AMBULATORY_CARE_PROVIDER_SITE_OTHER): Payer: Medicaid Other | Admitting: Psychiatry

## 2016-08-19 VITALS — BP 140/108 | Ht 70.0 in | Wt 195.0 lb

## 2016-08-19 DIAGNOSIS — Z79899 Other long term (current) drug therapy: Secondary | ICD-10-CM

## 2016-08-19 DIAGNOSIS — Z811 Family history of alcohol abuse and dependence: Secondary | ICD-10-CM

## 2016-08-19 DIAGNOSIS — Z8249 Family history of ischemic heart disease and other diseases of the circulatory system: Secondary | ICD-10-CM | POA: Diagnosis not present

## 2016-08-19 DIAGNOSIS — F411 Generalized anxiety disorder: Secondary | ICD-10-CM | POA: Diagnosis not present

## 2016-08-19 DIAGNOSIS — F4310 Post-traumatic stress disorder, unspecified: Secondary | ICD-10-CM | POA: Diagnosis not present

## 2016-08-19 MED ORDER — FLUOXETINE HCL 40 MG PO CAPS: 40.0000 mg | ORAL_CAPSULE | Freq: Every day | ORAL | 2 refills | Status: DC

## 2016-08-19 MED ORDER — ALPRAZOLAM 1 MG PO TABS: 1.0000 mg | ORAL_TABLET | Freq: Four times a day (QID) | ORAL | 2 refills | Status: DC | PRN

## 2016-08-19 MED ORDER — LAMOTRIGINE 100 MG PO TABS: 100.0000 mg | ORAL_TABLET | Freq: Two times a day (BID) | ORAL | 2 refills | Status: DC
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
## 2016-09-01 ENCOUNTER — Ambulatory Visit (INDEPENDENT_AMBULATORY_CARE_PROVIDER_SITE_OTHER): Payer: Medicaid Other | Admitting: Orthopaedic Surgery

## 2016-09-01 ENCOUNTER — Encounter: Payer: Self-pay | Admitting: Orthopaedic Surgery

## 2016-09-01 VITALS — BP 168/115 | HR 76 | Temp 97.9°F | Ht 70.0 in | Wt 194.0 lb

## 2016-09-01 DIAGNOSIS — M25512 Pain in left shoulder: Secondary | ICD-10-CM | POA: Diagnosis not present

## 2016-09-01 DIAGNOSIS — M545 Low back pain: Secondary | ICD-10-CM | POA: Diagnosis not present

## 2016-09-01 DIAGNOSIS — F17210 Nicotine dependence, cigarettes, uncomplicated: Secondary | ICD-10-CM | POA: Diagnosis not present

## 2016-09-01 DIAGNOSIS — G8929 Other chronic pain: Secondary | ICD-10-CM

## 2016-09-01 MED ORDER — HYDROCODONE-ACETAMINOPHEN 7.5-325 MG PO TABS: ORAL_TABLET | ORAL | 0 refills | Status: DC

## 2016-09-01 NOTE — Progress Notes (Signed)
Patient ZO:XWRUE:Cody Walls, male DOB:1970-09-06, 46 y.o. AVW:[id number]  Chief Complaint  Patient presents with  . Follow-up    CHRONIC BACK PAIN + RT SHOULDER PAIN    HPI  Cody Walls is a 46 y.o. male who has chronic pain of his back and his right shoulder.  His right shoulder is more painful today.  He has no trauma.  He has no paresthesias.  His lower back is stable.  He has no paresthesias or new trauma.  He is doing his exercises for that.  He continues to smoke.  He says he is willing to cut back but has said that before and I do not feel he is sincere on this. HPI  Body mass index is 27.84 kg/m.  ROS  Review of Systems  HENT: Negative for congestion.   Respiratory: Negative for cough and shortness of breath.   Cardiovascular: Negative for chest pain and leg swelling.  Endocrine: Negative for cold intolerance.  Genitourinary:       History of kidney stone   Musculoskeletal: Positive for arthralgias, back pain and myalgias.  Allergic/Immunologic: Negative for environmental allergies.  Psychiatric/Behavioral: The patient is nervous/anxious.   All other systems reviewed and are negative.   Past Medical History:  Diagnosis Date  . Anxiety   . Blood poisoning (HCC)   . Bursitis   . Chronic back pain   . Kidney stones   . PTSD (post-traumatic stress disorder)   . Tendonitis   . Torn rotator cuff     Past Surgical History:  Procedure Laterality Date  . KIDNEY STONE SURGERY      Family History  Problem Relation Age of Onset  . Heart failure Other   . Alcohol abuse Father     Social History Social History  Substance Use Topics  . Smoking status: Current Every Day Smoker    Packs/day: 1.50    Years: 28.00    Types: Cigarettes  . Smokeless tobacco: Never Used  . Alcohol use 1.8 oz/week    3 Cans of beer per week     Comment: occasional    No Known Allergies  Current Outpatient Prescriptions  Medication Sig Dispense Refill  . ALPRAZolam (XANAX) 1  MG tablet Take 1 tablet (1 mg total) by mouth 4 (four) times daily as needed for sleep or anxiety. 120 tablet 2  . aspirin EC 81 MG tablet Take 81 mg by mouth daily.    Marland Kitchen. FLUoxetine (PROZAC) 40 MG capsule Take 1 capsule (40 mg total) by mouth daily. 30 capsule 2  . HYDROcodone-acetaminophen (NORCO) 7.5-325 MG tablet One every six hours as needed for pain.  Seven day limit per Medicaid guidelines. 28 tablet 0  . ibuprofen (ADVIL,MOTRIN) 200 MG tablet Take 200 mg by mouth every 6 (six) hours as needed for pain.    Marland Kitchen. lamoTRIgine (LAMICTAL) 100 MG tablet Take 1 tablet (100 mg total) by mouth 2 (two) times daily. 60 tablet 2  . omeprazole (PRILOSEC) 40 MG capsule Take 40 mg by mouth as needed (acid reflux).     Marland Kitchen. tiZANidine (ZANAFLEX) 4 MG tablet Take 1 tablet (4 mg total) by mouth every 8 (eight) hours as needed for muscle spasms. 30 tablet 0   No current facility-administered medications for this visit.      Physical Exam  Blood pressure (!) 168/115, pulse 76, temperature 97.9 F (36.6 C), height 5\' 10"  (1.778 m), weight 194 lb (88 kg).  Constitutional: overall normal hygiene, normal nutrition, well  developed, normal grooming, normal body habitus. Assistive device:none  Musculoskeletal: gait and station Limp none, muscle tone and strength are normal, no tremors or atrophy is present.  .  Neurological: coordination overall normal.  Deep tendon reflex/nerve stretch intact.  Sensation normal.  Cranial nerves II-XII intact.   Skin:   Normal overall no scars, lesions, ulcers or rashes. No psoriasis.  Psychiatric: Alert and oriented x 3.  Recent memory intact, remote memory unclear.  Normal mood and affect. Well groomed.  Good eye contact.  Cardiovascular: overall no swelling, no varicosities, no edema bilaterally, normal temperatures of the legs and arms, no clubbing, cyanosis and good capillary refill.  Lymphatic: palpation is normal.  Spine/Pelvis examination:  Inspection:  Overall,  sacoiliac joint benign and hips nontender; without crepitus or defects.   Thoracic spine inspection: Alignment normal without kyphosis present   Lumbar spine inspection:  Alignment  with normal lumbar lordosis, without scoliosis apparent.   Thoracic spine palpation:  without tenderness of spinal processes   Lumbar spine palpation: with tenderness of lumbar area; without tightness of lumbar muscles    Range of Motion:   Lumbar flexion, forward flexion is 45 without pain or tenderness    Lumbar extension is 10 without pain or tenderness   Left lateral bend is Normal  without pain or tenderness   Right lateral bend is Normal without pain or tenderness   Straight leg raising is Normal   Strength & tone: Normal   Stability overall normal stability    Examination of right Upper Extremity is done.  Inspection:   Overall:  Elbow non-tender without crepitus or defects, forearm non-tender without crepitus or defects, wrist non-tender without crepitus or defects, hand non-tender.    Shoulder: with glenohumeral joint tenderness, without effusion.   Upper arm: with swelling and tenderness   Range of motion:   Overall:  Full range of motion of the elbow, full range of motion of wrist and full range of motion in fingers.   Shoulder:  right  120 degrees forward flexion; 90 degrees abduction; 30 degrees internal rotation, 30 degrees external rotation, 15 degrees extension, 40 degrees adduction.   Stability:   Overall:  Shoulder, elbow and wrist stable   Strength and Tone:   Overall full shoulder muscles strength, full upper arm strength and normal upper arm bulk and tone.   The patient has been educated about the nature of the problem(s) and counseled on treatment options.  The patient appeared to understand what I have discussed and is in agreement with it.  Encounter Diagnoses  Name Primary?  . Chronic left shoulder pain Yes  . Chronic midline low back pain without sciatica   .  Cigarette nicotine dependence without complication     PLAN Call if any problems.  Precautions discussed.  Continue current medications.   Return to clinic 3 months   I have reviewed the [facility] Controlled Substance Reporting System web site prior to prescribing narcotic medicine for this patient.  Electronically Signed Darreld Mclean, MD 2/8/20189:12 AM

## 2016-09-01 NOTE — Patient Instructions (Signed)
Steps to Quit Smoking Smoking tobacco can be bad for your health. It can also affect almost every organ in your body. Smoking puts you and people around you at risk for many serious long-lasting (chronic) diseases. Quitting smoking is hard, but it is one of the best things that you can do for your health. It is never too late to quit. What are the benefits of quitting smoking? When you quit smoking, you lower your risk for getting serious diseases and conditions. They can include:  Lung cancer or lung disease.  Heart disease.  Stroke.  Heart attack.  Not being able to have children (infertility).  Weak bones (osteoporosis) and broken bones (fractures). If you have coughing, wheezing, and shortness of breath, those symptoms may get better when you quit. You may also get sick less often. If you are pregnant, quitting smoking can help to lower your chances of having a baby of low birth weight. What can I do to help me quit smoking? Talk with your doctor about what can help you quit smoking. Some things you can do (strategies) include:  Quitting smoking totally, instead of slowly cutting back how much you smoke over a period of time.  Going to in-person counseling. You are more likely to quit if you go to many counseling sessions.  Using resources and support systems, such as:  Online chats with a counselor.  Phone quitlines.  Printed self-help materials.  Support groups or group counseling.  Text messaging programs.  Mobile phone apps or applications.  Taking medicines. Some of these medicines may have nicotine in them. If you are pregnant or breastfeeding, do not take any medicines to quit smoking unless your doctor says it is okay. Talk with your doctor about counseling or other things that can help you. Talk with your doctor about using more than one strategy at the same time, such as taking medicines while you are also going to in-person counseling. This can help make quitting  easier. What things can I do to make it easier to quit? Quitting smoking might feel very hard at first, but there is a lot that you can do to make it easier. Take these steps:  Talk to your family and friends. Ask them to support and encourage you.  Call phone quitlines, reach out to support groups, or work with a counselor.  Ask people who smoke to not smoke around you.  Avoid places that make you want (trigger) to smoke, such as:  Bars.  Parties.  Smoke-break areas at work.  Spend time with people who do not smoke.  Lower the stress in your life. Stress can make you want to smoke. Try these things to help your stress:  Getting regular exercise.  Deep-breathing exercises.  Yoga.  Meditating.  Doing a body scan. To do this, close your eyes, focus on one area of your body at a time from head to toe, and notice which parts of your body are tense. Try to relax the muscles in those areas.  Download or buy apps on your mobile phone or tablet that can help you stick to your quit plan. There are many free apps, such as QuitGuide from the CDC (Centers for Disease Control and Prevention). You can find more support from smokefree.gov and other websites. This information is not intended to replace advice given to you by your health care provider. Make sure you discuss any questions you have with your health care provider. Document Released: 05/07/2009 Document Revised: 03/08/2016 Document   Reviewed: 11/25/2014 Elsevier Interactive Patient Education  2017 Elsevier Inc.  

## 2016-11-14 ENCOUNTER — Ambulatory Visit (INDEPENDENT_AMBULATORY_CARE_PROVIDER_SITE_OTHER): Payer: Medicaid Other | Admitting: Psychiatry

## 2016-11-14 ENCOUNTER — Encounter (INDEPENDENT_AMBULATORY_CARE_PROVIDER_SITE_OTHER): Payer: Self-pay

## 2016-11-14 ENCOUNTER — Encounter (HOSPITAL_COMMUNITY): Payer: Self-pay | Admitting: Psychiatry

## 2016-11-14 VITALS — BP 90/66 | HR 84 | Ht 70.0 in | Wt 193.8 lb

## 2016-11-14 DIAGNOSIS — Z79899 Other long term (current) drug therapy: Secondary | ICD-10-CM | POA: Diagnosis not present

## 2016-11-14 DIAGNOSIS — Z7982 Long term (current) use of aspirin: Secondary | ICD-10-CM | POA: Diagnosis not present

## 2016-11-14 DIAGNOSIS — F4310 Post-traumatic stress disorder, unspecified: Secondary | ICD-10-CM

## 2016-11-14 DIAGNOSIS — Z811 Family history of alcohol abuse and dependence: Secondary | ICD-10-CM

## 2016-11-14 DIAGNOSIS — F411 Generalized anxiety disorder: Secondary | ICD-10-CM | POA: Diagnosis not present

## 2016-11-14 MED ORDER — FLUOXETINE HCL 40 MG PO CAPS: 40.0000 mg | ORAL_CAPSULE | Freq: Every day | ORAL | 2 refills | Status: DC

## 2016-11-14 MED ORDER — ALPRAZOLAM 1 MG PO TABS: 1.0000 mg | ORAL_TABLET | Freq: Four times a day (QID) | ORAL | 2 refills | Status: DC | PRN

## 2016-11-14 MED ORDER — LAMOTRIGINE 100 MG PO TABS: 100.0000 mg | ORAL_TABLET | Freq: Two times a day (BID) | ORAL | 2 refills | Status: DC
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
## 2016-11-29 ENCOUNTER — Ambulatory Visit: Payer: Self-pay | Admitting: Orthopaedic Surgery

## 2017-01-31 ENCOUNTER — Encounter (HOSPITAL_COMMUNITY): Payer: Self-pay | Admitting: Psychiatry

## 2017-01-31 ENCOUNTER — Ambulatory Visit (INDEPENDENT_AMBULATORY_CARE_PROVIDER_SITE_OTHER): Payer: Medicaid Other | Admitting: Psychiatry

## 2017-01-31 VITALS — BP 150/104 | HR 98 | Ht 70.0 in | Wt 192.4 lb

## 2017-01-31 DIAGNOSIS — F411 Generalized anxiety disorder: Secondary | ICD-10-CM | POA: Diagnosis not present

## 2017-01-31 DIAGNOSIS — F4310 Post-traumatic stress disorder, unspecified: Secondary | ICD-10-CM

## 2017-01-31 DIAGNOSIS — Z811 Family history of alcohol abuse and dependence: Secondary | ICD-10-CM | POA: Diagnosis not present

## 2017-01-31 MED ORDER — FLUOXETINE HCL 40 MG PO CAPS: 40.0000 mg | ORAL_CAPSULE | Freq: Every day | ORAL | 2 refills | Status: DC

## 2017-01-31 MED ORDER — ALPRAZOLAM 1 MG PO TABS: 1.0000 mg | ORAL_TABLET | Freq: Four times a day (QID) | ORAL | 2 refills | Status: DC | PRN

## 2017-01-31 MED ORDER — LAMOTRIGINE 100 MG PO TABS: 100.0000 mg | ORAL_TABLET | Freq: Two times a day (BID) | ORAL | 2 refills | Status: DC
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
## 2017-03-25 ENCOUNTER — Emergency Department (HOSPITAL_COMMUNITY)
Admission: EM | Admit: 2017-03-25 | Discharge: 2017-03-25 | Disposition: A | Discharge status: Home / Self Care | Payer: Medicaid Other | Attending: Emergency Medicine | Admitting: Emergency Medicine

## 2017-03-25 ENCOUNTER — Encounter (HOSPITAL_COMMUNITY): Payer: Self-pay | Admitting: Emergency Medicine

## 2017-03-25 DIAGNOSIS — H6121 Impacted cerumen, right ear: Secondary | ICD-10-CM | POA: Diagnosis not present

## 2017-03-25 DIAGNOSIS — H9201 Otalgia, right ear: Secondary | ICD-10-CM | POA: Diagnosis present

## 2017-03-25 DIAGNOSIS — H6501 Acute serous otitis media, right ear: Secondary | ICD-10-CM

## 2017-03-25 DIAGNOSIS — Z7982 Long term (current) use of aspirin: Secondary | ICD-10-CM | POA: Diagnosis not present

## 2017-03-25 DIAGNOSIS — F17210 Nicotine dependence, cigarettes, uncomplicated: Secondary | ICD-10-CM | POA: Insufficient documentation

## 2017-03-25 DIAGNOSIS — Z79899 Other long term (current) drug therapy: Secondary | ICD-10-CM | POA: Diagnosis not present

## 2017-03-25 MED ORDER — CARBAMIDE PEROXIDE 6.5 % OT SOLN
10.0000 [drp] | Freq: Once | OTIC | Status: AC
  Administered 2017-03-25: 10 [drp] via OTIC
  Filled 2017-03-25: qty 15

## 2017-03-25 MED ORDER — AMOXICILLIN 500 MG PO CAPS: 500.0000 mg | ORAL_CAPSULE | Freq: Three times a day (TID) | ORAL | 0 refills | Status: DC

## 2017-03-25 NOTE — ED Triage Notes (Signed)
Patient complaining of right ear pain since yesterday. 

## 2017-03-25 NOTE — Discharge Instructions (Signed)
Apply 5-6 drops of the debrox to the right ear 3 times a day for 2-3 days then as needed.  Follow-up with your doctor for recheck or return here if needed

## 2017-03-25 NOTE — ED Notes (Signed)
Flushed ears until water was clean.  Pt says he can hear better.  Tolerated well.

## 2017-03-26 ENCOUNTER — Emergency Department (HOSPITAL_COMMUNITY)
Admission: EM | Admit: 2017-03-26 | Discharge: 2017-03-26 | Disposition: A | Discharge status: Home / Self Care | Payer: Medicaid Other | Attending: Emergency Medicine | Admitting: Emergency Medicine

## 2017-03-26 ENCOUNTER — Encounter (HOSPITAL_COMMUNITY): Payer: Self-pay | Admitting: *Deleted

## 2017-03-26 DIAGNOSIS — H60331 Swimmer's ear, right ear: Secondary | ICD-10-CM | POA: Insufficient documentation

## 2017-03-26 DIAGNOSIS — H9201 Otalgia, right ear: Secondary | ICD-10-CM | POA: Diagnosis present

## 2017-03-26 DIAGNOSIS — F17210 Nicotine dependence, cigarettes, uncomplicated: Secondary | ICD-10-CM | POA: Insufficient documentation

## 2017-03-26 DIAGNOSIS — Z79899 Other long term (current) drug therapy: Secondary | ICD-10-CM | POA: Diagnosis not present

## 2017-03-26 DIAGNOSIS — Z7982 Long term (current) use of aspirin: Secondary | ICD-10-CM | POA: Diagnosis not present

## 2017-03-26 MED ORDER — CIPROFLOXACIN-HYDROCORTISONE 0.2-1 % OT SUSP: 3.0000 [drp] | Freq: Two times a day (BID) | OTIC | 0 refills | Status: DC

## 2017-03-26 MED ORDER — CIPROFLOXACIN-DEXAMETHASONE 0.3-0.1 % OT SUSP
3.0000 [drp] | Freq: Two times a day (BID) | OTIC | Status: DC
  Filled 2017-03-26: qty 7.5

## 2017-03-26 MED ORDER — IBUPROFEN 800 MG PO TABS
800.0000 mg | ORAL_TABLET | Freq: Once | ORAL | Status: AC
  Administered 2017-03-26: 800 mg via ORAL
  Filled 2017-03-26: qty 1

## 2017-03-26 NOTE — Discharge Instructions (Addendum)
Apply 3 drops of the antibiotic drop given to your right ear 2 times daily,lying with that ear out for 5 minutes to make sure that the medicine gets absorbed.  Continue taking your amoxicillin as well.  See your doctor on Tuesday as planned.

## 2017-03-26 NOTE — ED Provider Notes (Signed)
[facility] Provider Note   CSN: [id number] Arrival date & time: 03/26/17  1112     History   Chief Complaint Chief Complaint  Patient presents with  . Otalgia    HPI Cody Walls is a 46 y.o. male who returns Today after being seen here yesterday for a right otitis media, currently on day 2 of amoxicillin but with persistent pain in that right ear along with decreased hearing acuity.  Pain radiates to his right jaw area and is worse with chewing and with palpation and traction of the right ear.  He has had no drainage from the ear and denies fevers or chills.  He has no dizziness or tinnitus.     The history is provided by the patient and the spouse.    Past Medical History:  Diagnosis Date  . Anxiety   . Blood poisoning   . Bursitis   . Chronic back pain   . Kidney stones   . PTSD (post-traumatic stress disorder)   . Tendonitis   . Torn rotator cuff     Patient Active Problem List   Diagnosis Date Noted  . Midline low back pain without sciatica 09/10/2015  . PTSD (post-traumatic stress disorder) 08/16/2013  . FELON 06/01/2010    Past Surgical History:  Procedure Laterality Date  . KIDNEY STONE SURGERY         Home Medications    Prior to Admission medications   Medication Sig Start Date End Date Taking? Authorizing Provider  ALPRAZolam Prudy Feeler) 1 MG tablet Take 1 tablet (1 mg total) by mouth 4 (four) times daily as needed for sleep or anxiety. 01/31/17   Myrlene Broker, MD  amoxicillin (AMOXIL) 500 MG capsule Take 1 capsule (500 mg total) by mouth 3 (three) times daily. 03/25/17   Triplett, Tammy, PA-C  aspirin EC 81 MG tablet Take 81 mg by mouth daily.    [provider]  FLUoxetine (PROZAC) 40 MG capsule Take 1 capsule (40 mg total) by mouth daily. 01/31/17 01/31/18  Myrlene Broker, MD  ibuprofen (ADVIL,MOTRIN) 200 MG tablet Take 200 mg by mouth every 6 (six) hours as needed for pain.    [provider]  lamoTRIgine (LAMICTAL) 100  MG tablet Take 1 tablet (100 mg total) by mouth 2 (two) times daily. 01/31/17   Myrlene Broker, MD  LISINOPRIL PO Take by mouth.    [provider]  omeprazole (PRILOSEC) 40 MG capsule Take 40 mg by mouth as needed (acid reflux).     [provider]  tiZANidine (ZANAFLEX) 4 MG tablet Take 1 tablet (4 mg total) by mouth every 8 (eight) hours as needed for muscle spasms. 06/02/16   Darreld Mclean, MD    Family History Family History  Problem Relation Age of Onset  . Heart failure Other   . Alcohol abuse Father     Social History Social History  Substance Use Topics  . Smoking status: Current Every Day Smoker    Packs/day: 1.50    Years: 28.00    Types: Cigarettes  . Smokeless tobacco: Never Used  . Alcohol use 1.8 oz/week    3 Cans of beer per week     Comment: occasional     Allergies   Patient has no known allergies.   Review of Systems Review of Systems  Constitutional: Negative for chills and fever.  HENT: Positive for ear pain and sore throat. Negative for congestion, rhinorrhea, sinus pressure, trouble swallowing and voice  change.   Eyes: Negative for discharge.  Respiratory: Negative for cough, shortness of breath, wheezing and stridor.   Cardiovascular: Negative for chest pain.  Gastrointestinal: Negative for abdominal pain.  Genitourinary: Negative.      Physical Exam Updated Vital Signs BP (!) 141/95 (BP Location: Right Arm)   Pulse 94   Temp 99 F (37.2 C) (Oral)   Resp 18   Ht 5\' 9"  (1.753 m)   Wt 90.7 kg (200 lb)   SpO2 94%   BMI 29.53 kg/m   Physical Exam  Constitutional: He is oriented to person, place, and time. He appears well-developed and well-nourished.  HENT:  Head: Normocephalic and atraumatic.  Right Ear: Ear canal normal. There is swelling. No drainage. Decreased hearing is noted.  Left Ear: Tympanic membrane and ear canal normal.  Nose: Mucosal edema and rhinorrhea present.  Mouth/Throat: Uvula is midline,  oropharynx is clear and moist and mucous membranes are normal. No oropharyngeal exudate, posterior oropharyngeal edema, posterior oropharyngeal erythema or tonsillar abscesses.  Pain with traction of right external ear.  Edema of the external canal without drainage.  There is no complete occlusion of the canal, but edematous enough to not allow visualization of the TM.  Eyes: Conjunctivae are normal.  Cardiovascular: Normal rate and normal heart sounds.   Pulmonary/Chest: Effort normal. No respiratory distress. He has no wheezes. He has no rales.  Abdominal: Soft. There is no tenderness.  Musculoskeletal: Normal range of motion.  Lymphadenopathy:       Head (right side): Submandibular adenopathy present.  Neurological: He is alert and oriented to person, place, and time.  Skin: Skin is warm and dry. No rash noted.  Psychiatric: He has a normal mood and affect.     ED Treatments / Results  Labs (all labs ordered are listed, but only abnormal results are displayed) Labs Reviewed - No data to display  EKG  EKG Interpretation None       Radiology No results found.  Procedures Procedures (including critical care time)  Medications Ordered in ED Medications  ibuprofen (ADVIL,MOTRIN) tablet 800 mg (not administered)  ciprofloxacin-hydrocortisone (CIPRO HC OTIC) 0.2-1 % OTIC (EAR) suspension 3 drop (not administered)     Initial Impression / Assessment and Plan / ED Course  I have reviewed the triage vital signs and the nursing notes.  Pertinent labs & imaging results that were available during my care of the patient were reviewed by me and considered in my medical decision making (see chart for details).     Pt with otitis externa, right.  Advised to continue Amoxil, added Cipro HC otic.  Patient has an appointment with his PCP in 2 days.  When necessary follow-up anticipated here.    Final Clinical Impressions(s) / ED Diagnoses   Final diagnoses:  Acute swimmer's ear of  right side    New Prescriptions New Prescriptions   No medications on file     Victoriano Laindol, Andie Mungin, PA-C 03/26/17 1231    Linwood DibblesKnapp, Jon, MD 03/29/17 1116

## 2017-03-26 NOTE — ED Notes (Addendum)
C/o right ear pain, rating pain 10/10.  Continues to experience hearing loss to right ear.  Pain is extending from right ear to mid neck and having difficulty swallowing.  Has taken 4 antibiotics pills that were given yesterday.

## 2017-03-26 NOTE — ED Triage Notes (Signed)
Pt c/o right ear pain x 3 days. Pt reports he was here yesterday and given antibiotics and has been taking them but the pain is no better.

## 2017-03-27 NOTE — ED Provider Notes (Signed)
[facility] Provider Note   CSN: [id number] Arrival date & time: 03/25/17  1112     History   Chief Complaint Chief Complaint  Patient presents with  . Otalgia    HPI Cody Walls is a 46 y.o. male.  HPI  Cody Walls is a 46 y.o. male who presents to the Emergency Department complaining of right ear pain for one day. He describes a sharp, stabbing pain to his ear and decreased hearing.  He has been using OTC wax removal drops and sweet oil in his ear without relief.  Pain worsens with chewing.  He denies dizziness, sore throat, dental pain, headache and fever.  Past Medical History:  Diagnosis Date  . Anxiety   . Blood poisoning   . Bursitis   . Chronic back pain   . Kidney stones   . PTSD (post-traumatic stress disorder)   . Tendonitis   . Torn rotator cuff     Patient Active Problem List   Diagnosis Date Noted  . Midline low back pain without sciatica 09/10/2015  . PTSD (post-traumatic stress disorder) 08/16/2013  . FELON 06/01/2010    Past Surgical History:  Procedure Laterality Date  . KIDNEY STONE SURGERY         Home Medications    Prior to Admission medications   Medication Sig Start Date End Date Taking? Authorizing Provider  ALPRAZolam Prudy Feeler) 1 MG tablet Take 1 tablet (1 mg total) by mouth 4 (four) times daily as needed for sleep or anxiety. 01/31/17   Myrlene Broker, MD  amoxicillin (AMOXIL) 500 MG capsule Take 1 capsule (500 mg total) by mouth 3 (three) times daily. 03/25/17   Keionna Kinnaird, PA-C  aspirin EC 81 MG tablet Take 81 mg by mouth daily.    [provider]  ciprofloxacin-hydrocortisone (CIPRO HC) OTIC suspension Place 3 drops into the right ear 2 (two) times daily. 03/26/17 04/02/17  Burgess Amor, PA-C  FLUoxetine (PROZAC) 40 MG capsule Take 1 capsule (40 mg total) by mouth daily. 01/31/17 01/31/18  Myrlene Broker, MD  ibuprofen (ADVIL,MOTRIN) 200 MG tablet Take 200 mg by mouth every 6 (six) hours as needed for pain.     [provider]  lamoTRIgine (LAMICTAL) 100 MG tablet Take 1 tablet (100 mg total) by mouth 2 (two) times daily. 01/31/17   Myrlene Broker, MD  LISINOPRIL PO Take by mouth.    [provider]  omeprazole (PRILOSEC) 40 MG capsule Take 40 mg by mouth as needed (acid reflux).     [provider]  tiZANidine (ZANAFLEX) 4 MG tablet Take 1 tablet (4 mg total) by mouth every 8 (eight) hours as needed for muscle spasms. 06/02/16   Darreld Mclean, MD    Family History Family History  Problem Relation Age of Onset  . Heart failure Other   . Alcohol abuse Father     Social History Social History  Substance Use Topics  . Smoking status: Current Every Day Smoker    Packs/day: 1.50    Years: 28.00    Types: Cigarettes  . Smokeless tobacco: Never Used  . Alcohol use 1.8 oz/week    3 Cans of beer per week     Comment: occasional     Allergies   Patient has no known allergies.   Review of Systems Review of Systems  Constitutional: Negative for activity change, appetite change, chills and fever.  HENT: Positive for ear pain and hearing loss. Negative for congestion, dental  problem, drooling, ear discharge, facial swelling, rhinorrhea, sore throat and trouble swallowing.   Respiratory: Negative for cough, shortness of breath, wheezing and stridor.   Gastrointestinal: Negative for nausea and vomiting.  Musculoskeletal: Negative for neck pain and neck stiffness.  Skin: Negative.   Neurological: Negative for dizziness, weakness, numbness and headaches.  Hematological: Negative for adenopathy.  Psychiatric/Behavioral: Negative for confusion.  All other systems reviewed and are negative.    Physical Exam Updated Vital Signs BP (!) 155/103 (BP Location: Right Arm)   Pulse 90   Temp 98.5 F (36.9 C) (Oral)   Resp 18   Ht 5\' 9"  (1.753 m)   Wt 90.7 kg (200 lb)   SpO2 99%   BMI 29.53 kg/m   Physical Exam  Constitutional: He is oriented to person, place, and  time. He appears well-developed and well-nourished. No distress.  HENT:  Head: Normocephalic and atraumatic.  Right Ear: There is tenderness. No drainage. No mastoid tenderness. Decreased hearing is noted.  Left Ear: Tympanic membrane and ear canal normal.  Mouth/Throat: Uvula is midline, oropharynx is clear and moist and mucous membranes are normal. No uvula swelling. No oropharyngeal exudate.  Pain with movement of the external ear.  Moderate amt of cerumen present.  Unable to visualize the TM. No edema of the canal.  Neck: Normal range of motion. Neck supple.  Cardiovascular: Normal rate, regular rhythm, normal heart sounds and intact distal pulses.   No murmur heard. Pulmonary/Chest: Effort normal and breath sounds normal. No stridor. No respiratory distress.  Musculoskeletal: Normal range of motion.  Lymphadenopathy:    He has no cervical adenopathy.  Neurological: He is alert and oriented to person, place, and time. Coordination normal.  Skin: Skin is warm and dry. No rash noted.  Psychiatric: He has a normal mood and affect.  Nursing note and vitals reviewed.    ED Treatments / Results  Labs (all labs ordered are listed, but only abnormal results are displayed) Labs Reviewed - No data to display  EKG  EKG Interpretation None       Radiology No results found.  Procedures Procedures (including critical care time)  Medications Ordered in ED Medications  carbamide peroxide (DEBROX) 6.5 % OTIC (EAR) solution 10 drop (10 drops Right EAR Given 03/25/17 1145)     Initial Impression / Assessment and Plan / ED Course  I have reviewed the triage vital signs and the nursing notes.  Pertinent labs & imaging results that were available during my care of the patient were reviewed by me and considered in my medical decision making (see chart for details).     Pt with right ear pain and moderate amt of cerumen.  Debrox applied and ear canal irrigated with saline and peroxide  by nursing.  Small amt of cerumen removed, pain and hearing improved.   On recheck, TM partially visualized now and appears erythematous.  Will tx with continued debrox and amoxil.  Pt agrees to PCP f/u.  Return precautions discussed.  Final Clinical Impressions(s) / ED Diagnoses   Final diagnoses:  Right acute serous otitis media, recurrence not specified  Impacted cerumen of right ear    New Prescriptions Discharge Medication List as of 03/25/2017 12:45 PM    START taking these medications   Details  amoxicillin (AMOXIL) 500 MG capsule Take 1 capsule (500 mg total) by mouth 3 (three) times daily., Starting Sat 03/25/2017, Print         [place], [place], PA-C 03/27/17 1141  Linwood DibblesKnapp, Jon, MD 03/29/17 [phone]

## 2017-03-28 ENCOUNTER — Encounter: Payer: Self-pay | Admitting: Family

## 2017-03-28 ENCOUNTER — Ambulatory Visit (INDEPENDENT_AMBULATORY_CARE_PROVIDER_SITE_OTHER): Payer: Medicaid Other | Admitting: Family

## 2017-03-28 ENCOUNTER — Encounter (INDEPENDENT_AMBULATORY_CARE_PROVIDER_SITE_OTHER): Payer: Self-pay

## 2017-03-28 VITALS — BP 145/99 | HR 78 | Temp 98.5°F | Ht 69.0 in | Wt 195.6 lb

## 2017-03-28 DIAGNOSIS — H60501 Unspecified acute noninfective otitis externa, right ear: Secondary | ICD-10-CM

## 2017-03-28 DIAGNOSIS — F17200 Nicotine dependence, unspecified, uncomplicated: Secondary | ICD-10-CM

## 2017-03-28 MED ORDER — IBUPROFEN 600 MG PO TABS: 600.0000 mg | ORAL_TABLET | Freq: Three times a day (TID) | ORAL | 0 refills | Status: AC | PRN

## 2017-03-28 NOTE — Progress Notes (Signed)
   Subjective:    Patient ID: Cody RosebushFloyd C Guice, male    DOB: September 07, 1970, 46 y.o.   MRN: [id number]  Pt presents to the office today with right ear pain. Pt went to the ED on 09/01 and was diagnosed right acute serous otitis media. Pt's ear was cleaned and given rx of amoxicillin. Pt states his ear continued to hurt and went back to ED on 09/02 and was then given Cipro HC otic drops.   Pt states he is feeling slightly better and was actually able to sleep last night. Otalgia   There is pain in the right ear. This is a new problem. The current episode started in the past 7 days. The problem occurs constantly. The problem has been rapidly worsening. There has been no fever. The pain is at a severity of 9/10. The pain is moderate. Associated symptoms include coughing, ear discharge and hearing loss. Pertinent negatives include no rhinorrhea or sore throat. He has tried antibiotics and ear drops for the symptoms. The treatment provided mild relief.      Review of Systems  HENT: Positive for ear discharge, ear pain and hearing loss. Negative for rhinorrhea and sore throat.   Respiratory: Positive for cough.   All other systems reviewed and are negative.      Objective:   Physical Exam  Constitutional: He is oriented to person, place, and time. He appears well-developed and well-nourished. No distress.  HENT:  Head: Normocephalic.  Right Ear: There is swelling and tenderness. Decreased hearing is noted.  Left Ear: External ear normal.  Mouth/Throat: Oropharynx is clear and moist.  Eyes: Pupils are equal, round, and reactive to light. Right eye exhibits no discharge. Left eye exhibits no discharge.  Neck: Normal range of motion. Neck supple. No thyromegaly present.  Cardiovascular: Normal rate, regular rhythm, normal heart sounds and intact distal pulses.   No murmur heard. Pulmonary/Chest: Effort normal and breath sounds normal. No respiratory distress. He has no wheezes.  Abdominal: Soft.  Bowel sounds are normal. He exhibits no distension. There is no tenderness.  Musculoskeletal: Normal range of motion. He exhibits no edema or tenderness.  Neurological: He is alert and oriented to person, place, and time.  Skin: Skin is warm and dry. No rash noted. No erythema.  Psychiatric: He has a normal mood and affect. His behavior is normal. Judgment and thought content normal.  Vitals reviewed.     BP (!) 145/99   Pulse 78   Temp 98.5 F (36.9 C) (Oral)   Ht 5\' 9"  (1.753 m)   Wt 195 lb 9.6 oz (88.7 kg)   BMI 28.89 kg/m      Assessment & Plan:  1. Acute otitis externa of right ear, unspecified type Keep clean and dry Do not stick anything into ear Motrin as needed Continue amoxicillin and Ciprodex drops RTO in 1 month for CPE - ibuprofen (ADVIL,MOTRIN) 600 MG tablet; Take 1 tablet (600 mg total) by mouth every 8 (eight) hours as needed.  Dispense: 30 tablet; Refill: 0  2. Current smoker Smoking cessation discussed    Jannifer Rodneyhristy Aldean Pipe, FNP

## 2017-03-28 NOTE — Patient Instructions (Signed)

## 2017-04-12 ENCOUNTER — Telehealth (HOSPITAL_COMMUNITY): Payer: Self-pay | Admitting: Psychiatry

## 2017-04-28 ENCOUNTER — Encounter: Payer: Medicaid Other | Admitting: Family

## 2017-05-02 ENCOUNTER — Ambulatory Visit (HOSPITAL_COMMUNITY): Payer: Self-pay | Admitting: Psychiatry

## 2017-05-03 ENCOUNTER — Other Ambulatory Visit (HOSPITAL_COMMUNITY): Payer: Self-pay | Admitting: Psychiatry

## 2017-05-03 ENCOUNTER — Telehealth (HOSPITAL_COMMUNITY): Payer: Self-pay | Admitting: *Deleted

## 2017-05-03 NOTE — Telephone Encounter (Signed)
noted 

## 2017-05-03 NOTE — Telephone Encounter (Signed)
Pt pharmacy [facility] requesting refills for pt Xanax QID PRN. Per pt chart, pt medication was last filled on 01-31-2017 with 120 tabs 2 refills. Pt f/u appt is 05-04-2017. Pt pharmacy number is [phone].

## 2017-05-03 NOTE — Telephone Encounter (Signed)
Will not order this time as he has appointment tomorrow.

## 2017-05-04 ENCOUNTER — Ambulatory Visit (INDEPENDENT_AMBULATORY_CARE_PROVIDER_SITE_OTHER): Payer: Medicaid Other | Admitting: Psychiatry

## 2017-05-04 ENCOUNTER — Encounter (HOSPITAL_COMMUNITY): Payer: Self-pay | Admitting: Psychiatry

## 2017-05-04 VITALS — BP 113/80 | HR 89 | Ht 69.0 in | Wt 202.0 lb

## 2017-05-04 DIAGNOSIS — F4310 Post-traumatic stress disorder, unspecified: Secondary | ICD-10-CM

## 2017-05-04 DIAGNOSIS — F329 Major depressive disorder, single episode, unspecified: Secondary | ICD-10-CM

## 2017-05-04 DIAGNOSIS — F411 Generalized anxiety disorder: Secondary | ICD-10-CM | POA: Diagnosis not present

## 2017-05-04 DIAGNOSIS — F17210 Nicotine dependence, cigarettes, uncomplicated: Secondary | ICD-10-CM | POA: Diagnosis not present

## 2017-05-04 DIAGNOSIS — Z811 Family history of alcohol abuse and dependence: Secondary | ICD-10-CM

## 2017-05-04 DIAGNOSIS — Z79899 Other long term (current) drug therapy: Secondary | ICD-10-CM

## 2017-05-04 MED ORDER — FLUOXETINE HCL 40 MG PO CAPS: 40.0000 mg | ORAL_CAPSULE | Freq: Every day | ORAL | 2 refills | Status: DC

## 2017-05-04 MED ORDER — ALPRAZOLAM 1 MG PO TABS: 1.0000 mg | ORAL_TABLET | Freq: Four times a day (QID) | ORAL | 2 refills | Status: DC | PRN

## 2017-05-04 MED ORDER — LAMOTRIGINE 100 MG PO TABS: 100.0000 mg | ORAL_TABLET | Freq: Two times a day (BID) | ORAL | 2 refills | Status: DC
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
## 2017-05-08 ENCOUNTER — Ambulatory Visit: Payer: Medicaid Other | Admitting: Family

## 2017-06-07 ENCOUNTER — Ambulatory Visit (INDEPENDENT_AMBULATORY_CARE_PROVIDER_SITE_OTHER): Payer: Medicaid Other

## 2017-06-07 ENCOUNTER — Encounter: Payer: Self-pay | Admitting: Family

## 2017-06-07 ENCOUNTER — Ambulatory Visit (INDEPENDENT_AMBULATORY_CARE_PROVIDER_SITE_OTHER): Payer: Medicaid Other | Admitting: Family

## 2017-06-07 VITALS — BP 162/111 | HR 77 | Temp 98.4°F | Ht 69.0 in | Wt 206.6 lb

## 2017-06-07 DIAGNOSIS — I10 Essential (primary) hypertension: Secondary | ICD-10-CM

## 2017-06-07 DIAGNOSIS — M542 Cervicalgia: Secondary | ICD-10-CM

## 2017-06-07 DIAGNOSIS — Z1159 Encounter for screening for other viral diseases: Secondary | ICD-10-CM

## 2017-06-07 DIAGNOSIS — K2100 Gastro-esophageal reflux disease with esophagitis, without bleeding: Secondary | ICD-10-CM

## 2017-06-07 DIAGNOSIS — Z0000 Encounter for general adult medical examination without abnormal findings: Secondary | ICD-10-CM | POA: Diagnosis not present

## 2017-06-07 DIAGNOSIS — F17200 Nicotine dependence, unspecified, uncomplicated: Secondary | ICD-10-CM

## 2017-06-07 DIAGNOSIS — F4310 Post-traumatic stress disorder, unspecified: Secondary | ICD-10-CM

## 2017-06-07 DIAGNOSIS — K219 Gastro-esophageal reflux disease without esophagitis: Secondary | ICD-10-CM | POA: Insufficient documentation

## 2017-06-07 DIAGNOSIS — Z114 Encounter for screening for human immunodeficiency virus [HIV]: Secondary | ICD-10-CM

## 2017-06-07 MED ORDER — MELOXICAM 15 MG PO TABS: 15.0000 mg | ORAL_TABLET | Freq: Every day | ORAL | 0 refills | Status: AC

## 2017-06-07 MED ORDER — LISINOPRIL 20 MG PO TABS: 20.0000 mg | ORAL_TABLET | Freq: Every day | ORAL | 3 refills | Status: AC

## 2017-06-07 NOTE — Addendum Note (Signed)
Addended by: Jannifer RodneyHAWKS, Japji Kok A on: 06/07/2017 01:15 PM   Modules accepted: Orders

## 2017-06-07 NOTE — Progress Notes (Addendum)
Subjective:    Patient ID: Cody Walls, male    DOB: [birth year], 46 y.o.   MRN: [id number]  PT presents to the office today for CPE. Pt is followed by [facility] every 3 months for PTSD that is stable. Pt states he has forgotten to take his BP medications for the last two days.   His significant other states he does not take his BP medications regularly because this causes issues with an erection.  Hypertension  This is a chronic problem. The current episode started more than 1 year ago. The problem has been waxing and waning since onset. The problem is uncontrolled. Associated symptoms include neck pain. Pertinent negatives include no headaches, malaise/fatigue or peripheral edema. Risk factors for coronary artery disease include male gender, obesity, family history, sedentary lifestyle and smoking/tobacco exposure. The current treatment provides mild improvement. There is no history of kidney disease, CAD/MI, CVA or heart failure.  Neck Pain   The current episode started more than 1 year ago. The problem occurs intermittently. The problem has been waxing and waning. The pain is present in the left side. The quality of the pain is described as aching. The pain is at a severity of 10/10. The pain is mild. The symptoms are aggravated by twisting. Pertinent negatives include no headaches or weakness. He has tried NSAIDs and oral narcotics for the symptoms. The treatment provided mild relief.  Gastroesophageal Reflux  He reports no belching, no coughing or no heartburn. This is a chronic problem. The current episode started more than 1 year ago. The problem occurs occasionally. The problem has been resolved. The symptoms are aggravated by smoking. Risk factors include smoking/tobacco exposure. He has tried a PPI for the symptoms. The treatment provided moderate relief.      Review of Systems  Constitutional: Negative for malaise/fatigue.  Respiratory: Negative for cough.     Gastrointestinal: Negative for heartburn.  Musculoskeletal: Positive for neck pain.  Neurological: Negative for weakness and headaches.    Family History  Problem Relation Age of Onset  . Heart failure Other   . Alcohol abuse Father    Social History   Socioeconomic History  . Marital status: Legally Separated    Spouse name: None  . Number of children: None  . Years of education: None  . Highest education level: None  Social Needs  . Financial resource strain: None  . Food insecurity - worry: None  . Food insecurity - inability: None  . Transportation needs - medical: None  . Transportation needs - non-medical: None  Occupational History  . None  Tobacco Use  . Smoking status: Current Every Day Smoker    Packs/day: 1.50    Years: 28.00    Pack years: 42.00    Types: Cigarettes  . Smokeless tobacco: Never Used  Substance and Sexual Activity  . Alcohol use: Yes    Alcohol/week: 1.8 oz    Types: 3 Cans of beer per week    Comment: occasional  . Drug use: Yes    Types: Marijuana  . Sexual activity: Yes    Birth control/protection: None  Other Topics Concern  . None  Social History Narrative  . None       Objective:   Physical Exam    BP (!) 156/105   Pulse 77   Temp 98.4 F (36.9 C) (Oral)   Ht 5' 9"  (1.753 m)   Wt 206 lb 9.6 oz (93.7 kg)   BMI 30.51 kg/m  Assessment & Plan:  1. Annual physical exam - CMP14+EGFR - Lipid panel - CBC with Differential/Platelet - TSH - VITAMIN D 25 Hydroxy (Vit-D Deficiency, Fractures) - PSA, total and free  2. PTSD (post-traumatic stress disorder) - CMP14+EGFR - CBC with Differential/Platelet  3. Current smoker Smoking cessation discussed - CMP14+EGFR - CBC with Differential/Platelet  4. Essential hypertension - CMP14+EGFR - CBC with Differential/Platelet  5. Gastroesophageal reflux disease with esophagitis - CMP14+EGFR - CBC with Differential/Platelet  6. Neck pain Rest Ice  ROM exercises  discussed Start Mobic  And Physical Therapy - CMP14+EGFR - CBC with Differential/Platelet - Ambulatory referral to Physical Therapy - DG Cervical Spine Complete; Future - meloxicam (MOBIC) 15 MG tablet; Take 1 tablet (15 mg total) daily by mouth.  Dispense: 30 tablet; Refill: 0  7. Encounter for screening for HIV - HIV antibody  8. Need for hepatitis C screening test - Hepatitis C antibody   Continue all meds Labs pending Health Maintenance reviewed Diet and exercise encouraged RTO 6 months   Evelina Dun, FNP

## 2017-06-07 NOTE — Patient Instructions (Signed)
Cervical Radiculopathy  Cervical radiculopathy means that a nerve in the neck is pinched or bruised. This can cause pain or loss of feeling (numbness) that runs from your neck to your arm and fingers.  Follow these instructions at home:  Managing pain  ? Take over-the-counter and prescription medicines only as told by your doctor.  ? If directed, put ice on the injured or painful area.  ? Put ice in a plastic bag.  ? Place a towel between your skin and the bag.  ? Leave the ice on for 20 minutes, 2?3 times per day.  ? If ice does not help, you can try using heat. Take a warm shower or warm bath, or use a heat pack as told by your doctor.  ? You may try a gentle neck and shoulder massage.  Activity  ? Rest as needed. Follow instructions from your doctor about any activities to avoid.  ? Do exercises as told by your doctor or physical therapist.  General instructions  ? If you were given a soft collar, wear it as told by your doctor.  ? Use a flat pillow when you sleep.  ? Keep all follow-up visits as told by your doctor. This is important.  Contact a doctor if:  ? Your condition does not improve with treatment.  Get help right away if:  ? Your pain gets worse and is not controlled with medicine.  ? You lose feeling or feel weak in your hand, arm, face, or leg.  ? You have a fever.  ? You have a stiff neck.  ? You cannot control when you poop or pee (have incontinence).  ? You have trouble with walking, balance, or talking.  This information is not intended to replace advice given to you by your health care provider. Make sure you discuss any questions you have with your health care provider.  Document Released: 06/30/2011 Document Revised: 12/17/2015 Document Reviewed: 09/04/2014  Elsevier Interactive Patient Education ? 2018 Elsevier Inc[employer]

## 2017-06-08 ENCOUNTER — Other Ambulatory Visit: Payer: Self-pay | Admitting: Family

## 2017-06-08 DIAGNOSIS — M47812 Spondylosis without myelopathy or radiculopathy, cervical region: Secondary | ICD-10-CM

## 2017-06-08 DIAGNOSIS — E559 Vitamin D deficiency, unspecified: Secondary | ICD-10-CM | POA: Insufficient documentation

## 2017-06-08 DIAGNOSIS — G8929 Other chronic pain: Secondary | ICD-10-CM

## 2017-06-08 DIAGNOSIS — M542 Cervicalgia: Principal | ICD-10-CM

## 2017-06-08 DIAGNOSIS — E781 Pure hyperglyceridemia: Secondary | ICD-10-CM | POA: Insufficient documentation

## 2017-06-08 LAB — CBC WITH DIFFERENTIAL/PLATELET
BASOS ABS: 0.1 10*3/uL (ref 0.0–0.2)
BASOS: 1 %
EOS (ABSOLUTE): 0.3 10*3/uL (ref 0.0–0.4)
Eos: 3 %
Hematocrit: 43.7 % (ref 37.5–51.0)
Hemoglobin: 15.4 g/dL (ref 13.0–17.7)
IMMATURE GRANS (ABS): 0.1 10*3/uL (ref 0.0–0.1)
Immature Granulocytes: 1 %
LYMPHS: 34 %
Lymphocytes Absolute: 3.2 10*3/uL — ABNORMAL HIGH (ref 0.7–3.1)
MCH: 29.2 pg (ref 26.6–33.0)
MCHC: 35.2 g/dL (ref 31.5–35.7)
MCV: 83 fL (ref 79–97)
MONOCYTES: 9 %
Monocytes Absolute: 0.8 10*3/uL (ref 0.1–0.9)
NEUTROS ABS: 5.1 10*3/uL (ref 1.4–7.0)
Neutrophils: 52 %
PLATELETS: 292 10*3/uL (ref 150–379)
RBC: 5.27 x10E6/uL (ref 4.14–5.80)
RDW: 15.8 % — ABNORMAL HIGH (ref 12.3–15.4)
WBC: 9.5 10*3/uL (ref 3.4–10.8)

## 2017-06-08 LAB — PSA, TOTAL AND FREE
PROSTATE SPECIFIC AG, SERUM: 0.7 ng/mL (ref 0.0–4.0)
PSA FREE: 0.24 ng/mL
PSA, Free Pct: 34.3 %

## 2017-06-08 LAB — CMP14+EGFR
A/G RATIO: 1.8 (ref 1.2–2.2)
ALK PHOS: 81 IU/L (ref 39–117)
ALT: 26 IU/L (ref 0–44)
AST: 19 IU/L (ref 0–40)
Albumin: 4.6 g/dL (ref 3.5–5.5)
BUN/Creatinine Ratio: 12 (ref 9–20)
BUN: 9 mg/dL (ref 6–24)
CO2: 23 mmol/L (ref 20–29)
Calcium: 9.5 mg/dL (ref 8.7–10.2)
Chloride: 102 mmol/L (ref 96–106)
Creatinine, Ser: 0.74 mg/dL — ABNORMAL LOW (ref 0.76–1.27)
GFR calc Af Amer: 128 mL/min/{1.73_m2} (ref >59)
GFR calc non Af Amer: 111 mL/min/{1.73_m2} (ref >59)
GLOBULIN, TOTAL: 2.5 g/dL (ref 1.5–4.5)
Glucose: 86 mg/dL (ref 65–99)
POTASSIUM: 4.5 mmol/L (ref 3.5–5.2)
SODIUM: 143 mmol/L (ref 134–144)
TOTAL PROTEIN: 7.1 g/dL (ref 6.0–8.5)

## 2017-06-08 LAB — TSH: TSH: 1.55 u[IU]/mL (ref 0.450–4.500)

## 2017-06-08 LAB — LIPID PANEL
CHOL/HDL RATIO: 13.8 ratio — AB (ref 0.0–5.0)
CHOLESTEROL TOTAL: 332 mg/dL — AB (ref 100–199)
HDL: 24 mg/dL — ABNORMAL LOW (ref >39)
TRIGLYCERIDES: 1315 mg/dL — AB (ref 0–149)

## 2017-06-08 LAB — VITAMIN D 25 HYDROXY (VIT D DEFICIENCY, FRACTURES): VIT D 25 HYDROXY: 26.9 ng/mL — AB (ref 30.0–100.0)

## 2017-06-08 LAB — HEPATITIS C ANTIBODY: Hep C Virus Ab: <0.1 s/co ratio (ref 0.0–0.9)

## 2017-06-08 MED ORDER — VITAMIN D (ERGOCALCIFEROL) 1.25 MG (50000 UNIT) PO CAPS: 50000.0000 [IU] | ORAL_CAPSULE | ORAL | 3 refills | Status: AC

## 2017-06-08 MED ORDER — ROSUVASTATIN CALCIUM 20 MG PO TABS: 20.0000 mg | ORAL_TABLET | Freq: Every day | ORAL | 1 refills | Status: DC

## 2017-06-26 ENCOUNTER — Ambulatory Visit (INDEPENDENT_AMBULATORY_CARE_PROVIDER_SITE_OTHER): Payer: Medicaid Other | Admitting: Orthopaedic Surgery

## 2017-06-29 ENCOUNTER — Ambulatory Visit (INDEPENDENT_AMBULATORY_CARE_PROVIDER_SITE_OTHER): Payer: Medicaid Other | Admitting: Orthopaedic Surgery

## 2017-07-13 ENCOUNTER — Encounter (INDEPENDENT_AMBULATORY_CARE_PROVIDER_SITE_OTHER): Payer: Self-pay | Admitting: Orthopaedic Surgery

## 2017-07-13 ENCOUNTER — Ambulatory Visit (INDEPENDENT_AMBULATORY_CARE_PROVIDER_SITE_OTHER): Payer: Medicaid Other | Admitting: Orthopaedic Surgery

## 2017-07-13 VITALS — BP 127/82 | HR 97

## 2017-07-13 DIAGNOSIS — M4722 Other spondylosis with radiculopathy, cervical region: Secondary | ICD-10-CM

## 2017-07-13 NOTE — Progress Notes (Addendum)
Office Visit Note   Patient: Cody RosebushFloyd C Walls           Date of Birth: Feb 20, 1971           MRN: [id number] Visit Date: 07/13/2017              Requested by: Junie SpencerHawks, Christy A, FNP [address] [place], [place] [id number] PCP: Junie SpencerHawks, Christy A, FNP   Assessment & Plan: Visit Diagnoses:  1. Other spondylosis with radiculopathy, cervical region     Plan: Through therapy exercises before and has been doing those with persistent symptoms.  NSAIDS as  well as some narcotic medication Percocet.  I recommend proceeding with a repeat cervical MRI scan evaluate him for his radicular symptoms.  Office follow-up after scan for review.  No  Contrast needed for MRI scan.  Inflammatories ice, heat, massage.  Follow-Up Instructions: Follow-up after cervical MRI scan.  Orders:  No orders of the defined types were placed in this encounter.  No orders of the defined types were placed in this encounter.     Procedures: No procedures performed   Clinical Data: No additional findings.   Subjective: Chief Complaint  Patient presents with  . Neck - Pain    HPI 46-year-old male seen for consultation request of Dr. Lendon ColonelHawks for chronic neck pain greater than 1 year.  He has been on anti-inflammatories recently over years ago when he was narcotic medication for his pain.  Years ago he saw Dr. Gerlene FeeKritzer an MRI scan showed cervical spondylosis at C5-6 and C6-7.  Review of Systems smoker review of systems positive hypertension GERD, vitamin D deficiency high cholesterol.  History of swimmer's ear, any depression, PTSD.   Objective: Vital Signs: BP 127/82   Pulse 97   Physical Exam  Constitutional: He is oriented to person, place, and time. He appears well-developed and well-nourished.  HENT:  Head: Normocephalic and atraumatic.  Eyes: EOM are normal. Pupils are equal, round, and reactive to light.  Neck: No tracheal deviation present. No thyromegaly present.  Cardiovascular: Normal rate.    Pulmonary/Chest: Effort normal. He has no wheezes.  Abdominal: Soft. Bowel sounds are normal.  Musculoskeletal:  No atrophy upper lower extremities normal heel toe gait.  Neurological: He is alert and oriented to person, place, and time.  Skin: Skin is warm and dry. Capillary refill takes less than 2 seconds.  Psychiatric: He has a normal mood and affect. His behavior is normal. Judgment and thought content normal.    Ortho Exam positive Spurling left greater than right..  Positive impingement left shoulder.  Upper extremity reflexes are 1+ and symmetrical biceps triceps brachioradialis.  Subluxation of the shoulders.  Full extension of the elbows in flexion.  Decreased sensation left C6 fusion without thenar or hyperthenar atrophy.  Negative Phalen's carpal compression is negative.  No hyperthenar atrophy or interossei weakness.  Straight leg raising 90 degrees.  Specialty Comments:  No specialty comments available.  Imaging: MRI scan showed disc protrusion with effacement of the anterior CSF 5 6 C6-7 worse on the left than right 5 6 right and left at C6-7 plain radiograph showed spurring at C5-6 C6-7.   PMFS History: Patient Active Problem List   Diagnosis Date Noted  . Vitamin D deficiency 06/08/2017  . Hypertriglyceridemia 06/08/2017  . Hypertension 06/07/2017  . GERD (gastroesophageal reflux disease) 06/07/2017  . Current smoker 03/28/2017  . Midline low back pain without sciatica 09/10/2015  . PTSD (post-traumatic stress disorder) 08/16/2013  . FELON  06/01/2010   Past Medical History:  Diagnosis Date  . Anxiety   . Blood poisoning   . Bursitis   . Chronic back pain   . Kidney stones   . PTSD (post-traumatic stress disorder)   . Tendonitis   . Torn rotator cuff     Family History  Problem Relation Age of Onset  . Heart failure Other   . Alcohol abuse Father     Past Surgical History:  Procedure Laterality Date  . KIDNEY STONE SURGERY     Social History    Occupational History  . Not on file  Tobacco Use  . Smoking status: Current Every Day Smoker    Packs/day: 1.50    Years: 28.00    Pack years: 42.00    Types: Cigarettes  . Smokeless tobacco: Never Used  Substance and Sexual Activity  . Alcohol use: Yes    Alcohol/week: 1.8 oz    Types: 3 Cans of beer per week    Comment: occasional  . Drug use: Yes    Types: Marijuana  . Sexual activity: Yes    Birth control/protection: None

## 2017-07-27 ENCOUNTER — Ambulatory Visit (INDEPENDENT_AMBULATORY_CARE_PROVIDER_SITE_OTHER): Payer: Medicaid Other | Admitting: Orthopaedic Surgery

## 2017-07-31 ENCOUNTER — Telehealth: Payer: Self-pay | Admitting: Family

## 2017-07-31 ENCOUNTER — Ambulatory Visit (HOSPITAL_COMMUNITY): Payer: Medicaid Other | Admitting: Psychiatry

## 2017-07-31 ENCOUNTER — Encounter (HOSPITAL_COMMUNITY): Payer: Self-pay | Admitting: Psychiatry

## 2017-07-31 VITALS — BP 149/104 | HR 78 | Ht 69.0 in | Wt 204.0 lb

## 2017-07-31 DIAGNOSIS — F4310 Post-traumatic stress disorder, unspecified: Secondary | ICD-10-CM | POA: Diagnosis not present

## 2017-07-31 DIAGNOSIS — F17210 Nicotine dependence, cigarettes, uncomplicated: Secondary | ICD-10-CM

## 2017-07-31 DIAGNOSIS — M542 Cervicalgia: Secondary | ICD-10-CM

## 2017-07-31 DIAGNOSIS — F1210 Cannabis abuse, uncomplicated: Secondary | ICD-10-CM | POA: Diagnosis not present

## 2017-07-31 MED ORDER — FLUOXETINE HCL 40 MG PO CAPS: 40.0000 mg | ORAL_CAPSULE | Freq: Every day | ORAL | 2 refills | Status: DC

## 2017-07-31 MED ORDER — ROSUVASTATIN CALCIUM 20 MG PO TABS: 20.0000 mg | ORAL_TABLET | Freq: Every day | ORAL | 1 refills | Status: AC

## 2017-07-31 MED ORDER — LAMOTRIGINE 100 MG PO TABS
100.0000 mg | ORAL_TABLET | Freq: Two times a day (BID) | ORAL | 2 refills | Status: DC
Start: 2017-07-31 — End: 2017-10-11

## 2017-07-31 MED ORDER — ALPRAZOLAM 1 MG PO TABS: 1.0000 mg | ORAL_TABLET | Freq: Four times a day (QID) | ORAL | 2 refills | Status: DC | PRN

## 2017-07-31 NOTE — Telephone Encounter (Signed)
Contacted [facility] and they will cancel their presription, resent to [facility], pt aware
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
## 2017-08-31 ENCOUNTER — Ambulatory Visit (INDEPENDENT_AMBULATORY_CARE_PROVIDER_SITE_OTHER): Payer: Medicaid Other | Admitting: Orthopaedic Surgery

## 2017-09-14 ENCOUNTER — Ambulatory Visit (INDEPENDENT_AMBULATORY_CARE_PROVIDER_SITE_OTHER): Payer: Medicaid Other | Admitting: Orthopaedic Surgery

## 2017-10-11 ENCOUNTER — Encounter (HOSPITAL_COMMUNITY): Payer: Self-pay | Admitting: Psychiatry

## 2017-10-11 ENCOUNTER — Ambulatory Visit (HOSPITAL_COMMUNITY): Payer: Medicaid Other | Admitting: Psychiatry

## 2017-10-11 VITALS — BP 133/87 | HR 93 | Ht 69.0 in | Wt 197.0 lb

## 2017-10-11 DIAGNOSIS — M542 Cervicalgia: Secondary | ICD-10-CM

## 2017-10-11 DIAGNOSIS — Z811 Family history of alcohol abuse and dependence: Secondary | ICD-10-CM

## 2017-10-11 DIAGNOSIS — F17210 Nicotine dependence, cigarettes, uncomplicated: Secondary | ICD-10-CM | POA: Diagnosis not present

## 2017-10-11 DIAGNOSIS — F1290 Cannabis use, unspecified, uncomplicated: Secondary | ICD-10-CM

## 2017-10-11 DIAGNOSIS — F4310 Post-traumatic stress disorder, unspecified: Secondary | ICD-10-CM

## 2017-10-11 DIAGNOSIS — F1099 Alcohol use, unspecified with unspecified alcohol-induced disorder: Secondary | ICD-10-CM | POA: Diagnosis not present

## 2017-10-11 MED ORDER — LAMOTRIGINE 100 MG PO TABS: 100.0000 mg | ORAL_TABLET | Freq: Two times a day (BID) | ORAL | 2 refills | Status: DC

## 2017-10-11 MED ORDER — FLUOXETINE HCL 40 MG PO CAPS: 40.0000 mg | ORAL_CAPSULE | Freq: Every day | ORAL | 2 refills | Status: DC

## 2017-10-11 MED ORDER — ALPRAZOLAM 1 MG PO TABS: 1.0000 mg | ORAL_TABLET | Freq: Four times a day (QID) | ORAL | 2 refills | Status: DC | PRN
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
## 2017-12-05 ENCOUNTER — Ambulatory Visit: Payer: Medicaid Other | Admitting: Family

## 2017-12-06 ENCOUNTER — Encounter: Payer: Self-pay | Admitting: Family

## 2017-12-07 ENCOUNTER — Ambulatory Visit: Payer: Medicaid Other | Admitting: Family

## 2017-12-08 ENCOUNTER — Encounter: Payer: Self-pay | Admitting: Family

## 2017-12-12 ENCOUNTER — Other Ambulatory Visit: Payer: Self-pay | Admitting: Family

## 2017-12-23 ENCOUNTER — Other Ambulatory Visit (HOSPITAL_COMMUNITY): Payer: Self-pay | Admitting: Psychiatry

## 2017-12-25 ENCOUNTER — Ambulatory Visit (HOSPITAL_COMMUNITY): Payer: Medicaid Other | Admitting: Psychiatry

## 2017-12-25 ENCOUNTER — Encounter (HOSPITAL_COMMUNITY): Payer: Self-pay | Admitting: Psychiatry

## 2017-12-25 VITALS — BP 142/94 | HR 80 | Ht 69.0 in | Wt 194.0 lb

## 2017-12-25 DIAGNOSIS — Z79899 Other long term (current) drug therapy: Secondary | ICD-10-CM

## 2017-12-25 DIAGNOSIS — F329 Major depressive disorder, single episode, unspecified: Secondary | ICD-10-CM | POA: Diagnosis not present

## 2017-12-25 DIAGNOSIS — F17210 Nicotine dependence, cigarettes, uncomplicated: Secondary | ICD-10-CM

## 2017-12-25 DIAGNOSIS — Z811 Family history of alcohol abuse and dependence: Secondary | ICD-10-CM

## 2017-12-25 DIAGNOSIS — R21 Rash and other nonspecific skin eruption: Secondary | ICD-10-CM | POA: Diagnosis not present

## 2017-12-25 DIAGNOSIS — F411 Generalized anxiety disorder: Secondary | ICD-10-CM

## 2017-12-25 DIAGNOSIS — F4310 Post-traumatic stress disorder, unspecified: Secondary | ICD-10-CM

## 2017-12-25 MED ORDER — ALPRAZOLAM 1 MG PO TABS: 1.0000 mg | ORAL_TABLET | Freq: Four times a day (QID) | ORAL | 2 refills | Status: DC | PRN

## 2017-12-25 MED ORDER — LAMOTRIGINE 100 MG PO TABS: 100.0000 mg | ORAL_TABLET | Freq: Two times a day (BID) | ORAL | 2 refills | Status: DC

## 2017-12-25 MED ORDER — FLUOXETINE HCL 40 MG PO CAPS: 40.0000 mg | ORAL_CAPSULE | Freq: Every day | ORAL | 2 refills | Status: DC
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
## 2018-02-03 ENCOUNTER — Other Ambulatory Visit: Payer: Self-pay | Admitting: Family

## 2018-02-05 NOTE — Telephone Encounter (Signed)
Last seen 06/07/17

## 2018-02-09 ENCOUNTER — Ambulatory Visit: Payer: Medicaid Other | Admitting: Family

## 2018-02-23 ENCOUNTER — Ambulatory Visit: Payer: Medicaid Other | Admitting: Family

## 2018-02-27 ENCOUNTER — Encounter: Payer: Self-pay | Admitting: Family

## 2018-03-01 ENCOUNTER — Encounter: Payer: Self-pay | Admitting: Family

## 2018-03-01 ENCOUNTER — Ambulatory Visit: Payer: Medicaid Other | Admitting: Family

## 2018-03-01 ENCOUNTER — Ambulatory Visit (INDEPENDENT_AMBULATORY_CARE_PROVIDER_SITE_OTHER): Payer: Medicaid Other

## 2018-03-01 VITALS — BP 109/76 | HR 108 | Temp 97.5°F | Ht 69.0 in | Wt 187.0 lb

## 2018-03-01 DIAGNOSIS — F4310 Post-traumatic stress disorder, unspecified: Secondary | ICD-10-CM

## 2018-03-01 DIAGNOSIS — K2100 Gastro-esophageal reflux disease with esophagitis, without bleeding: Secondary | ICD-10-CM

## 2018-03-01 DIAGNOSIS — I10 Essential (primary) hypertension: Secondary | ICD-10-CM | POA: Diagnosis not present

## 2018-03-01 DIAGNOSIS — R34 Anuria and oliguria: Secondary | ICD-10-CM | POA: Diagnosis not present

## 2018-03-01 DIAGNOSIS — E781 Pure hyperglyceridemia: Secondary | ICD-10-CM

## 2018-03-01 DIAGNOSIS — M542 Cervicalgia: Secondary | ICD-10-CM

## 2018-03-01 DIAGNOSIS — Z114 Encounter for screening for human immunodeficiency virus [HIV]: Secondary | ICD-10-CM

## 2018-03-01 DIAGNOSIS — F17200 Nicotine dependence, unspecified, uncomplicated: Secondary | ICD-10-CM

## 2018-03-01 MED ORDER — PREDNISONE 10 MG (21) PO TBPK: ORAL_TABLET | ORAL | 0 refills | Status: DC

## 2018-03-01 NOTE — Patient Instructions (Signed)

## 2018-03-01 NOTE — Progress Notes (Signed)
Subjective:    Patient ID: Cody Walls, male    DOB: 1970-12-02, 47 y.o.   MRN: [id number]  Chief Complaint  Patient presents with  . scant urine  . Medical Management of Chronic Issues   PT presents to the office today for chronic follow up. He is followed by [facility] every 3 months for PTSD.  Hypertension  This is a chronic problem. The current episode started more than 1 year ago. The problem has been resolved since onset. The problem is controlled. Associated symptoms include malaise/fatigue, neck pain and shortness of breath. Pertinent negatives include no peripheral edema. Risk factors for coronary artery disease include obesity, male gender, smoking/tobacco exposure, family history and dyslipidemia. The current treatment provides moderate improvement. There is no history of kidney disease, CVA or heart failure.  Gastroesophageal Reflux  He reports no belching, no coughing or no heartburn. This is a chronic problem. The current episode started more than 1 month ago. The problem occurs occasionally. The problem has been waxing and waning. The symptoms are aggravated by certain foods. He has tried a PPI for the symptoms. The treatment provided moderate relief.  Neck Pain   This is a new problem. The current episode started 1 to 4 weeks ago. The problem occurs constantly. The problem has been waxing and waning. The pain is associated with nothing. The pain is present in the left side, right side and midline. The quality of the pain is described as aching. The pain is at a severity of 10/10. The pain is moderate. The symptoms are aggravated by bending, position and twisting. Associated symptoms include numbness and a visual change. He has tried NSAIDs for the symptoms. The treatment provided mild relief.  Flank Pain  This is a new problem. The current episode started in the past 7 days. The problem occurs intermittently. The problem has been waxing and waning since onset. Pain  location: bilateral flank. The quality of the pain is described as aching. The pain is mild. Associated symptoms include numbness.  Hyperlipidemia  This is a chronic problem. The current episode started more than 1 year ago. The problem is uncontrolled. Recent lipid tests were reviewed and are high. Associated symptoms include shortness of breath. Current antihyperlipidemic treatment includes statins. The current treatment provides moderate improvement of lipids.      Review of Systems  Constitutional: Positive for malaise/fatigue.  Respiratory: Positive for shortness of breath. Negative for cough.   Gastrointestinal: Negative for heartburn.  Genitourinary: Positive for flank pain.  Musculoskeletal: Positive for neck pain.  Neurological: Positive for numbness.  All other systems reviewed and are negative.      Objective:   Physical Exam  Constitutional: He is oriented to person, place, and time. He appears well-developed and well-nourished. No distress.  HENT:  Head: Normocephalic.  Right Ear: External ear normal.  Left Ear: External ear normal.  Mouth/Throat: Oropharynx is clear and moist.  Eyes: Pupils are equal, round, and reactive to light. Right eye exhibits no discharge. Left eye exhibits no discharge.  Neck: Normal range of motion. Neck supple. No thyromegaly present.  Cardiovascular: Normal rate, regular rhythm, normal heart sounds and intact distal pulses.  No murmur heard. Pulmonary/Chest: Effort normal and breath sounds normal. No respiratory distress. He has no wheezes.  Abdominal: Soft. Bowel sounds are normal. He exhibits no distension. There is no tenderness.  Musculoskeletal: He exhibits tenderness. He exhibits no edema.  Pain in bilateral posterior neck with flexion and extension  Neurological: He is alert and oriented to person, place, and time. He has normal reflexes. No cranial nerve deficit.  Skin: Skin is warm and dry. No rash noted. No erythema.    Psychiatric: He has a normal mood and affect. His behavior is normal. Judgment and thought content normal.  Vitals reviewed.     BP 109/76   Pulse (!) 108   Temp (!) 97.5 F (36.4 C) (Oral)   Ht 5' 9"  (1.753 m)   Wt 187 lb (84.8 kg)   BMI 27.62 kg/m      Assessment & Plan:  Cody Walls comes in today with chief complaint of scant urine and Medical Management of Chronic Issues   Diagnosis and orders addressed:  1. Urine production scanty - Urinalysis, Complete - CMP14+EGFR - CBC with Differential/Platelet  2. Neck pain Rest Ice ROM exercises  - DG Cervical Spine Complete; Future - CMP14+EGFR - predniSONE (STERAPRED UNI-PAK 21 TAB) 10 MG (21) TBPK tablet; Use as directed  Dispense: 21 tablet; Refill: 0 - CBC with Differential/Platelet  3. Essential hypertension - CMP14+EGFR - CBC with Differential/Platelet  4. PTSD (post-traumatic stress disorder) - CMP14+EGFR - CBC with Differential/Platelet  5. Gastroesophageal reflux disease with esophagitis - CMP14+EGFR - CBC with Differential/Platelet  6. Current smoker - CMP14+EGFR - CBC with Differential/Platelet  7. Hypertriglyceridemia - CMP14+EGFR - Lipid panel - CBC with Differential/Platelet   Labs pending Health Maintenance reviewed Diet and exercise encouraged  Follow up plan:  6 months   Evelina Dun, FNP

## 2018-03-02 ENCOUNTER — Other Ambulatory Visit: Payer: Self-pay | Admitting: Family

## 2018-03-02 ENCOUNTER — Encounter (HOSPITAL_COMMUNITY): Payer: Self-pay | Admitting: Emergency Medicine

## 2018-03-02 ENCOUNTER — Other Ambulatory Visit: Payer: Self-pay

## 2018-03-02 ENCOUNTER — Emergency Department (HOSPITAL_COMMUNITY)
Admission: EM | Admit: 2018-03-02 | Discharge: 2018-03-02 | Disposition: A | Discharge status: Home / Self Care | Payer: Medicaid Other | Attending: Emergency Medicine | Admitting: Emergency Medicine

## 2018-03-02 ENCOUNTER — Telehealth: Payer: Self-pay | Admitting: Family

## 2018-03-02 ENCOUNTER — Emergency Department (HOSPITAL_COMMUNITY): Payer: Medicaid Other

## 2018-03-02 DIAGNOSIS — R1032 Left lower quadrant pain: Secondary | ICD-10-CM | POA: Diagnosis not present

## 2018-03-02 DIAGNOSIS — N179 Acute kidney failure, unspecified: Secondary | ICD-10-CM | POA: Diagnosis not present

## 2018-03-02 DIAGNOSIS — R350 Frequency of micturition: Secondary | ICD-10-CM | POA: Diagnosis present

## 2018-03-02 DIAGNOSIS — F17210 Nicotine dependence, cigarettes, uncomplicated: Secondary | ICD-10-CM | POA: Insufficient documentation

## 2018-03-02 DIAGNOSIS — Z79899 Other long term (current) drug therapy: Secondary | ICD-10-CM | POA: Insufficient documentation

## 2018-03-02 DIAGNOSIS — I10 Essential (primary) hypertension: Secondary | ICD-10-CM | POA: Diagnosis not present

## 2018-03-02 LAB — CMP14+EGFR
A/G RATIO: 1.8 (ref 1.2–2.2)
ALBUMIN: 4.6 g/dL (ref 3.5–5.5)
ALK PHOS: 102 IU/L (ref 39–117)
ALT: 22 IU/L (ref 0–44)
AST: 22 IU/L (ref 0–40)
BUN / CREAT RATIO: 13 (ref 9–20)
BUN: 45 mg/dL — ABNORMAL HIGH (ref 6–24)
Bilirubin Total: 0.4 mg/dL (ref 0.0–1.2)
CO2: 17 mmol/L — ABNORMAL LOW (ref 20–29)
Calcium: 9.7 mg/dL (ref 8.7–10.2)
Chloride: 99 mmol/L (ref 96–106)
Creatinine, Ser: 3.45 mg/dL (ref 0.76–1.27)
GFR calc Af Amer: 23 mL/min/{1.73_m2} — ABNORMAL LOW (ref >59)
GFR calc non Af Amer: 20 mL/min/{1.73_m2} — ABNORMAL LOW (ref >59)
GLOBULIN, TOTAL: 2.6 g/dL (ref 1.5–4.5)
Glucose: 120 mg/dL — ABNORMAL HIGH (ref 65–99)
POTASSIUM: 4.2 mmol/L (ref 3.5–5.2)
SODIUM: 138 mmol/L (ref 134–144)
Total Protein: 7.2 g/dL (ref 6.0–8.5)

## 2018-03-02 LAB — CBC WITH DIFFERENTIAL/PLATELET
BASOS ABS: 0 10*3/uL (ref 0.0–0.2)
BASOS: 0 %
Basophils Absolute: 0.1 10*3/uL (ref 0.0–0.1)
Basophils Relative: 1 %
EOS (ABSOLUTE): 0.2 10*3/uL (ref 0.0–0.4)
Eos: 2 %
Eosinophils Absolute: 0.2 10*3/uL (ref 0.0–0.7)
Eosinophils Relative: 2 %
HEMATOCRIT: 45.8 % (ref 39.0–52.0)
HEMOGLOBIN: 16.2 g/dL (ref 13.0–17.7)
Hematocrit: 47.9 % (ref 37.5–51.0)
Hemoglobin: 15.7 g/dL (ref 13.0–17.0)
IMMATURE GRANS (ABS): 0 10*3/uL (ref 0.0–0.1)
IMMATURE GRANULOCYTES: 0 %
LYMPHS PCT: 32 %
LYMPHS: 26 %
Lymphocytes Absolute: 3.1 10*3/uL (ref 0.7–3.1)
Lymphs Abs: 3.3 10*3/uL (ref 0.7–4.0)
MCH: 28.6 pg (ref 26.6–33.0)
MCH: 29 pg (ref 26.0–34.0)
MCHC: 33.8 g/dL (ref 31.5–35.7)
MCHC: 34.3 g/dL (ref 30.0–36.0)
MCV: 85 fL (ref 79–97)
MCV: 84.5 fL (ref 78.0–100.0)
MONO ABS: 0.7 10*3/uL (ref 0.1–1.0)
MONOCYTES: 9 %
MONOS PCT: 6 %
Monocytes Absolute: 1.1 10*3/uL — ABNORMAL HIGH (ref 0.1–0.9)
NEUTROS ABS: 6 10*3/uL (ref 1.7–7.7)
NEUTROS PCT: 63 %
Neutrophils Absolute: 7.6 10*3/uL — ABNORMAL HIGH (ref 1.4–7.0)
Neutrophils Relative %: 59 %
PLATELETS: 312 10*3/uL (ref 150–450)
Platelets: 269 10*3/uL (ref 150–400)
RBC: 5.66 x10E6/uL (ref 4.14–5.80)
RBC: 5.42 MIL/uL (ref 4.22–5.81)
RDW: 16.1 % — ABNORMAL HIGH (ref 12.3–15.4)
RDW: 14.8 % (ref 11.5–15.5)
WBC: 11.9 10*3/uL — ABNORMAL HIGH (ref 3.4–10.8)
WBC: 10.3 10*3/uL (ref 4.0–10.5)

## 2018-03-02 LAB — LIPID PANEL
CHOL/HDL RATIO: 6.2 ratio — AB (ref 0.0–5.0)
CHOLESTEROL TOTAL: 174 mg/dL (ref 100–199)
HDL: 28 mg/dL — ABNORMAL LOW (ref >39)
TRIGLYCERIDES: 458 mg/dL — AB (ref 0–149)

## 2018-03-02 LAB — BASIC METABOLIC PANEL
ANION GAP: 6 (ref 5–15)
BUN: 26 mg/dL — ABNORMAL HIGH (ref 6–20)
CALCIUM: 9.3 mg/dL (ref 8.9–10.3)
CHLORIDE: 108 mmol/L (ref 98–111)
CO2: 26 mmol/L (ref 22–32)
CREATININE: 1.32 mg/dL — AB (ref 0.61–1.24)
GFR calc Af Amer: >60 mL/min (ref >60)
GFR calc non Af Amer: >60 mL/min (ref >60)
GLUCOSE: 101 mg/dL — AB (ref 70–99)
Potassium: 4.7 mmol/L (ref 3.5–5.1)
Sodium: 140 mmol/L (ref 135–145)

## 2018-03-02 LAB — URINALYSIS, ROUTINE W REFLEX MICROSCOPIC
Bilirubin Urine: NEGATIVE
GLUCOSE, UA: NEGATIVE mg/dL
Hgb urine dipstick: NEGATIVE
KETONES UR: NEGATIVE mg/dL
LEUKOCYTES UA: NEGATIVE
Nitrite: NEGATIVE
Protein, ur: NEGATIVE mg/dL
SPECIFIC GRAVITY, URINE: 1.023 (ref 1.005–1.030)
pH: 5 (ref 5.0–8.0)

## 2018-03-02 LAB — HIV ANTIBODY (ROUTINE TESTING W REFLEX): HIV SCREEN 4TH GENERATION: NONREACTIVE

## 2018-03-02 MED ORDER — SODIUM CHLORIDE 0.9 % IV BOLUS
1000.0000 mL | Freq: Once | INTRAVENOUS | Status: AC
  Administered 2018-03-02: 1000 mL via INTRAVENOUS

## 2018-03-02 NOTE — Discharge Instructions (Addendum)
You were evaluated in the emergency department for an abnormal blood test done by your doctor.  Your creatinine yesterday was 3.4.  We repeated the test today and it was 1.3.  You will need to continue to stay well-hydrated and avoid anti-inflammatories like ibuprofen and meloxicam.  Please call your doctor on Monday to have repeat blood work done.  Return if any concerns.

## 2018-03-02 NOTE — ED Provider Notes (Signed)
[facility]ER[facility]ENCY DEPARTMENT Provider Note   CSN: [id number] Arrival date & time: 03/02/18  1649     History   Chief Complaint Chief Complaint  Patient presents with  . Abnormal Lab    HPI Cody Walls is a 47 y.o. male.  Patient was sent in by his doctor's office for evaluation of abnormal labs.  He gone to the office because he has had a week's worth of low back pain and feeling like he was having more difficulty passing urine.  He had a history of a kidney stone and he was assuming this may be he was getting another one.  There is been no nausea no vomiting no blood in his urine.  He states he has been urinating less volume and may be going a little more frequently.  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  He is also been experiencing some neck pain that his doctors think is more arthritis or disc related.  They were going to get him started on some prednisone but he had not started that.  It does not sound like he takes ibuprofen very often.  The history is provided by the patient.  Abnormal Lab  Time since result:  Yesterday Patient referred by:  PCP Result type: chemistry   Chemistry:    Creatinine:  High   Past Medical History:  Diagnosis Date  . Anxiety   . Blood poisoning   . Bursitis   . Chronic back pain   . Kidney stones   . PTSD (post-traumatic stress disorder)   . Tendonitis   . Torn rotator cuff     Patient Active Problem List   Diagnosis Date Noted  . Vitamin D deficiency 06/08/2017  . Hypertriglyceridemia 06/08/2017  . Hypertension 06/07/2017  . GERD (gastroesophageal reflux disease) 06/07/2017  . Current smoker 03/28/2017  . Midline low back pain without sciatica 09/10/2015  . PTSD (post-traumatic stress disorder) 08/16/2013  . FELON 06/01/2010    Past Surgical History:  Procedure Laterality Date  . KIDNEY STONE SURGERY          Home Medications    Prior to Admission  medications   Medication Sig Start Date End Date Taking? Authorizing Provider  ALPRAZolam Prudy Feeler(XANAX) 1 MG tablet Take 1 tablet (1 mg total) by mouth 4 (four) times daily as needed for sleep or anxiety. 12/25/17   Myrlene Brokeross, Deborah R, MD  FLUoxetine (PROZAC) 40 MG capsule Take 1 capsule (40 mg total) by mouth daily. 12/25/17 12/25/18  Myrlene Brokeross, Deborah R, MD  ibuprofen (ADVIL,MOTRIN) 600 MG tablet Take 1 tablet (600 mg total) by mouth every 8 (eight) hours as needed. 03/28/17   Junie SpencerHawks, Christy A, FNP  lamoTRIgine (LAMICTAL) 100 MG tablet Take 1 tablet (100 mg total) by mouth 2 (two) times daily. 12/25/17   Myrlene Brokeross, Deborah R, MD  lisinopril (PRINIVIL,ZESTRIL) 20 MG tablet Take 1 tablet (20 mg total) daily by mouth. 06/07/17   Junie SpencerHawks, Christy A, FNP  meloxicam (MOBIC) 15 MG tablet Take 1 tablet (15 mg total) daily by mouth. 06/07/17   Hawks, Edilia Bohristy A, FNP  omeprazole (PRILOSEC) 40 MG capsule TAKE 1 CAPSULE BY MOUTH ONCE DAILY. 02/06/18   Junie SpencerHawks, Christy A, FNP  predniSONE (STERAPRED UNI-PAK 21 TAB) 10 MG (21) TBPK tablet Use as directed 03/01/18   Jannifer RodneyHawks, Christy A, FNP  rosuvastatin (CRESTOR) 20 MG tablet Take 1 tablet (20 mg total) by mouth daily.  07/31/17   Junie Spencer, FNP  Vitamin D, Ergocalciferol, (DRISDOL) 50000 units CAPS capsule Take 1 capsule (50,000 Units total) every 7 (seven) days by mouth. 06/08/17   Junie Spencer, FNP    Family History Family History  Problem Relation Age of Onset  . Heart failure Other   . Alcohol abuse Father     Social History Social History   Tobacco Use  . Smoking status: Current Every Day Smoker    Packs/day: 1.50    Years: 28.00    Pack years: 42.00    Types: Cigarettes  . Smokeless tobacco: Never Used  Substance Use Topics  . Alcohol use: Yes    Alcohol/week: 3.0 standard drinks    Types: 3 Cans of beer per week    Comment: occasional  . Drug use: Yes    Types: Marijuana     Allergies   Patient has no known allergies.   Review of Systems Review of Systems    Constitutional: Negative for fever.  HENT: Negative for sore throat.   Eyes: Negative for pain.  Respiratory: Negative for shortness of breath.   Cardiovascular: Negative for chest pain.  Gastrointestinal: Negative for abdominal pain.  Genitourinary: Positive for decreased urine volume, dysuria and frequency. Negative for testicular pain.  Musculoskeletal: Positive for back pain and neck pain.  Skin: Negative for rash.  Neurological: Negative for numbness.     Physical Exam Updated Vital Signs BP (!) 148/99 (BP Location: Right Arm)   Pulse 92   Temp 97.7 F (36.5 C) (Temporal)   Resp 18   Ht 5\' 9"  (1.753 m)   Wt 84.8 kg   SpO2 100%   BMI 27.62 kg/m   Physical Exam  Constitutional: He is oriented to person, place, and time. He appears well-developed and well-nourished.  HENT:  Head: Normocephalic and atraumatic.  Eyes: Conjunctivae are normal.  Neck: Neck supple.  Cardiovascular: Normal rate, regular rhythm, normal heart sounds and intact distal pulses.  No murmur heard. Pulmonary/Chest: Effort normal and breath sounds normal. No respiratory distress.  Abdominal: Soft. He exhibits no mass. There is no tenderness. There is no guarding.  Musculoskeletal: Normal range of motion. He exhibits no edema, tenderness or deformity.  Neurological: He is alert and oriented to person, place, and time. Gait normal. GCS eye subscore is 4. GCS verbal subscore is 5. GCS motor subscore is 6.  Skin: Skin is warm and dry.  Psychiatric: He has a normal mood and affect.  Nursing note and vitals reviewed.    ED Treatments / Results  Labs (all labs ordered are listed, but only abnormal results are displayed) Labs Reviewed  BASIC METABOLIC PANEL - Abnormal; Notable for the following components:      Result Value   Glucose, Bld 101 (*)    BUN 26 (*)    Creatinine, Ser 1.32 (*)    All other components within normal limits  CBC WITH DIFFERENTIAL/PLATELET  URINALYSIS, ROUTINE W REFLEX  MICROSCOPIC    EKG None  Radiology Ct Renal Stone Study  Result Date: 03/02/2018 CLINICAL DATA:  Bilateral flank pain with hematuria for 3 weeks. EXAM: CT ABDOMEN AND PELVIS WITHOUT CONTRAST TECHNIQUE: Multidetector CT imaging of the abdomen and pelvis was performed following the standard protocol without IV contrast. COMPARISON:  May 02, 2011 FINDINGS: Lower chest: No acute abnormality. Hepatobiliary: No focal liver abnormality is seen. No gallstones, gallbladder wall thickening, or biliary dilatation. Pancreas: Unremarkable. No pancreatic ductal dilatation or surrounding inflammatory changes. Spleen:  Normal in size without focal abnormality. Adrenals/Urinary Tract: The adrenal glands are normal. The right kidney is normal. There is a 1 mm nonobstructing stone in lower pole left kidney. There is no hydronephrosis bilaterally. The bladder is normal. Stomach/Bowel: Stomach is within normal limits. Appendix appears normal. No evidence of bowel wall thickening, distention, or inflammatory changes. Vascular/Lymphatic: Aortic atherosclerosis. No enlarged abdominal or pelvic lymph nodes. Reproductive: Prostate is unremarkable. Other: Small bilateral inguinal herniation of mesenteric fat is identified. Musculoskeletal: Minimal degenerative joint changes of the spine are noted. IMPRESSION: 1 mm nonobstructing stone in lower pole left kidney. No hydronephrosis is identified bilaterally. The bladder is normal. No bowel obstruction.  Normal appendix. Electronically Signed   By: Sherian Rein M.D.   On: 03/02/2018 19:21    Procedures Procedures (including critical care time)  Medications Ordered in ED Medications  sodium chloride 0.9 % bolus 1,000 mL (has no administration in time range)     Initial Impression / Assessment and Plan / ED Course  I have reviewed the triage vital signs and the nursing notes.  Pertinent labs & imaging results that were available during my care of the patient were reviewed  by me and considered in my medical decision making (see chart for details).  Clinical Course as of Mar 05 819  Caleen Essex Mar 02, 2018  1846 his lab work resulting and his creatinine is 1.32.  This is markedly better than his test yesterday when his creatinine was 3.45. He has had some iv fluids here, imaging showing no significant abnormality. I feel he can go home and have his pcp monitor this.    [MB]    Clinical Course User Index [MB] Terrilee Files, MD     Final Clinical Impressions(s) / ED Diagnoses   Final diagnoses:  AKI (acute kidney injury) [facility])    ED Discharge Orders    None       Terrilee Files, MD 03/04/18 [phone]

## 2018-03-02 NOTE — Telephone Encounter (Signed)
Aware. Go to emergency department per provider.

## 2018-03-02 NOTE — ED Triage Notes (Signed)
Patient states he was sent here for elevated creatinine. Patient complains of dysuria this week.

## 2018-03-05 LAB — MICROSCOPIC EXAMINATION
Bacteria, UA: NONE SEEN
Renal Epithel, UA: NONE SEEN /hpf

## 2018-03-05 LAB — URINALYSIS, COMPLETE
Bilirubin, UA: NEGATIVE
Glucose, UA: NEGATIVE
Ketones, UA: NEGATIVE
Leukocytes, UA: NEGATIVE
NITRITE UA: NEGATIVE
PH UA: 5 (ref 5.0–7.5)
RBC, UA: NEGATIVE
Specific Gravity, UA: >1.03 — ABNORMAL HIGH (ref 1.005–1.030)
Urobilinogen, Ur: 0.2 mg/dL (ref 0.2–1.0)

## 2018-03-27 ENCOUNTER — Ambulatory Visit (HOSPITAL_COMMUNITY): Payer: Medicaid Other | Admitting: Psychiatry

## 2018-03-27 ENCOUNTER — Encounter (HOSPITAL_COMMUNITY): Payer: Self-pay | Admitting: Psychiatry

## 2018-03-27 VITALS — BP 125/89 | HR 82 | Ht 69.0 in | Wt 186.2 lb

## 2018-03-27 DIAGNOSIS — F17210 Nicotine dependence, cigarettes, uncomplicated: Secondary | ICD-10-CM | POA: Diagnosis not present

## 2018-03-27 DIAGNOSIS — F411 Generalized anxiety disorder: Secondary | ICD-10-CM | POA: Diagnosis not present

## 2018-03-27 DIAGNOSIS — F4310 Post-traumatic stress disorder, unspecified: Secondary | ICD-10-CM | POA: Diagnosis not present

## 2018-03-27 DIAGNOSIS — Z811 Family history of alcohol abuse and dependence: Secondary | ICD-10-CM

## 2018-03-27 MED ORDER — LAMOTRIGINE 100 MG PO TABS: 100.0000 mg | ORAL_TABLET | Freq: Two times a day (BID) | ORAL | 2 refills | Status: DC

## 2018-03-27 MED ORDER — ALPRAZOLAM 1 MG PO TABS: 1.0000 mg | ORAL_TABLET | Freq: Four times a day (QID) | ORAL | 2 refills | Status: DC | PRN

## 2018-03-27 MED ORDER — FLUOXETINE HCL 40 MG PO CAPS: 40.0000 mg | ORAL_CAPSULE | Freq: Every day | ORAL | 2 refills | Status: DC
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
## 2018-06-25 ENCOUNTER — Other Ambulatory Visit: Payer: Self-pay | Admitting: Family

## 2018-06-26 ENCOUNTER — Ambulatory Visit (HOSPITAL_COMMUNITY): Payer: Self-pay | Admitting: Psychiatry

## 2018-06-28 ENCOUNTER — Encounter (HOSPITAL_COMMUNITY): Payer: Self-pay | Admitting: Psychiatry

## 2018-06-28 ENCOUNTER — Ambulatory Visit (INDEPENDENT_AMBULATORY_CARE_PROVIDER_SITE_OTHER): Payer: Medicaid Other | Admitting: Psychiatry

## 2018-06-28 VITALS — BP 158/96 | HR 108 | Ht 69.0 in | Wt 187.0 lb

## 2018-06-28 DIAGNOSIS — F411 Generalized anxiety disorder: Secondary | ICD-10-CM

## 2018-06-28 DIAGNOSIS — F4310 Post-traumatic stress disorder, unspecified: Secondary | ICD-10-CM

## 2018-06-28 MED ORDER — ALPRAZOLAM 1 MG PO TABS: 1.0000 mg | ORAL_TABLET | Freq: Four times a day (QID) | ORAL | 2 refills | Status: DC | PRN

## 2018-06-28 MED ORDER — LAMOTRIGINE 100 MG PO TABS: 100.0000 mg | ORAL_TABLET | Freq: Two times a day (BID) | ORAL | 2 refills | Status: DC

## 2018-06-28 MED ORDER — FLUOXETINE HCL 40 MG PO CAPS: 40.0000 mg | ORAL_CAPSULE | Freq: Every day | ORAL | 2 refills | Status: DC
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
## 2018-09-03 ENCOUNTER — Ambulatory Visit: Payer: Medicaid Other | Admitting: Family

## 2018-09-14 ENCOUNTER — Ambulatory Visit: Payer: Medicaid Other | Admitting: Family

## 2018-09-17 ENCOUNTER — Encounter: Payer: Self-pay | Admitting: Family

## 2018-09-25 ENCOUNTER — Encounter (HOSPITAL_COMMUNITY): Payer: Self-pay | Admitting: Psychiatry

## 2018-09-25 ENCOUNTER — Ambulatory Visit (INDEPENDENT_AMBULATORY_CARE_PROVIDER_SITE_OTHER): Payer: Medicaid Other | Admitting: Psychiatry

## 2018-09-25 VITALS — BP 130/90 | HR 98 | Ht 69.0 in | Wt 184.0 lb

## 2018-09-25 DIAGNOSIS — F4310 Post-traumatic stress disorder, unspecified: Secondary | ICD-10-CM

## 2018-09-25 DIAGNOSIS — F411 Generalized anxiety disorder: Secondary | ICD-10-CM

## 2018-09-25 MED ORDER — LAMOTRIGINE 100 MG PO TABS: 100.0000 mg | ORAL_TABLET | Freq: Two times a day (BID) | ORAL | 2 refills | Status: DC

## 2018-09-25 MED ORDER — FLUOXETINE HCL 40 MG PO CAPS: 40.0000 mg | ORAL_CAPSULE | Freq: Every day | ORAL | 2 refills | Status: DC

## 2018-09-25 MED ORDER — ALPRAZOLAM 1 MG PO TABS: 1.0000 mg | ORAL_TABLET | Freq: Four times a day (QID) | ORAL | 2 refills | Status: DC | PRN
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
## 2018-10-09 IMAGING — CT CT RENAL STONE PROTOCOL
2 of 4 series · 16 of 46 positions shown, 18 images · non-contrast
Comparison: May 02, 2011

CLINICAL DATA: Bilateral flank pain with hematuria for 3 weeks.

EXAM:
CT ABDOMEN AND PELVIS WITHOUT CONTRAST
TECHNIQUE: Multidetector CT imaging of the abdomen and pelvis was performed
following the standard protocol without IV contrast.

[Series 2: axial st · axial · 0.76mm/px · z∈[-650,-190]mm · 13 of 102 slices shown, 15 images]
[im 5/102  soft-tissue]
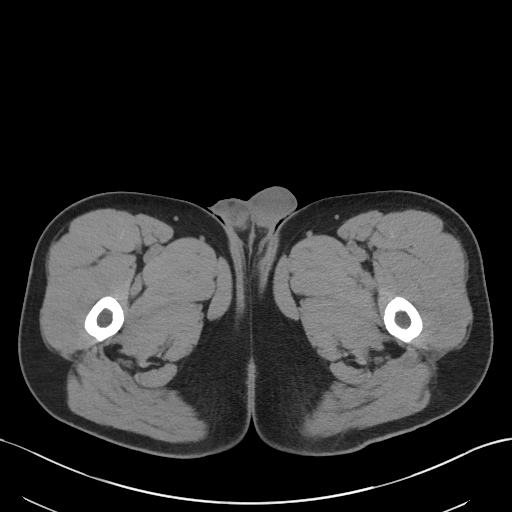
[im 5/102  bone]
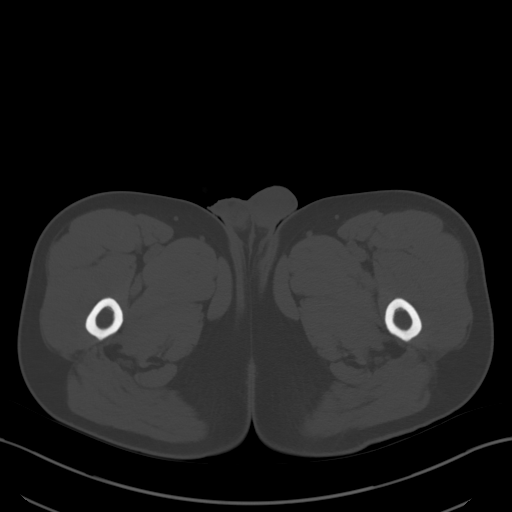
[im 13/102  soft-tissue]
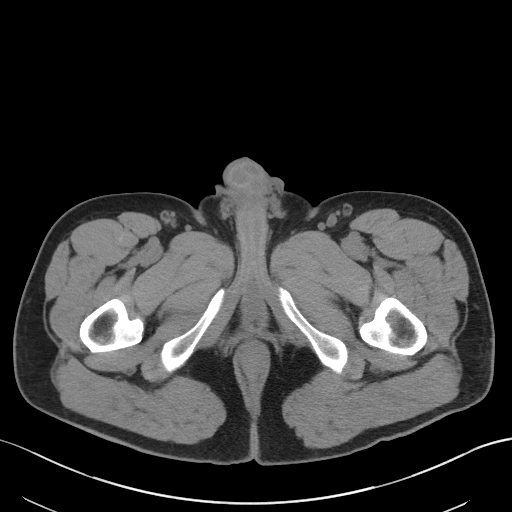
[im 21/102  soft-tissue]
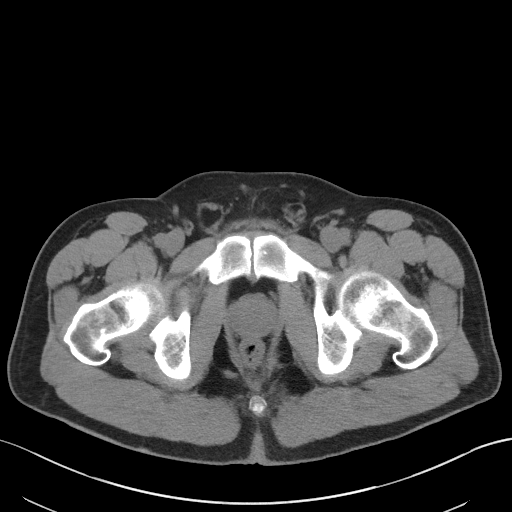
[im 29/102  soft-tissue]
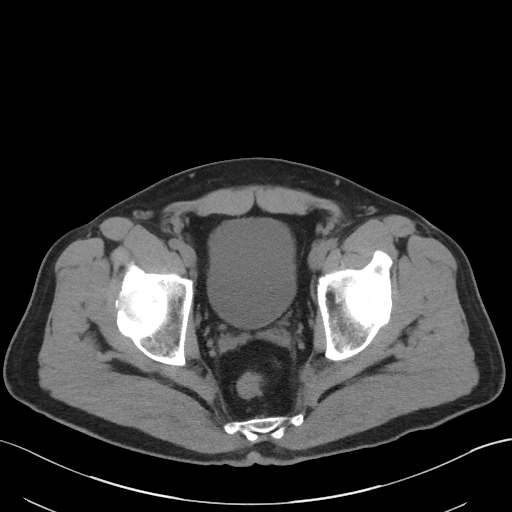
[im 37/102  soft-tissue]
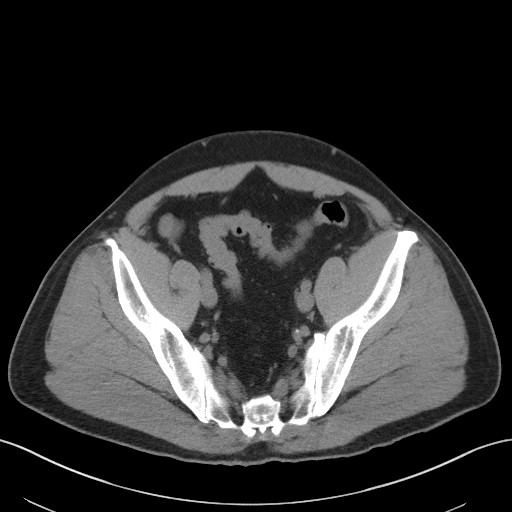
[im 45/102  soft-tissue]
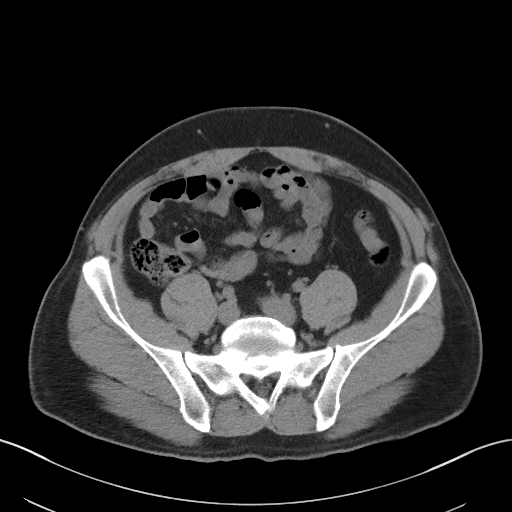
[im 53/102  soft-tissue]
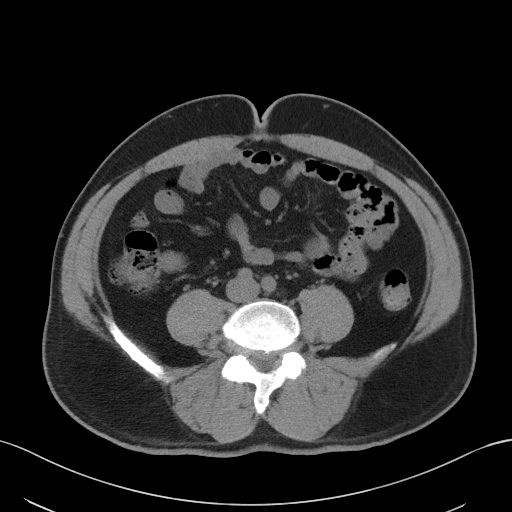
[im 57/102  soft-tissue]
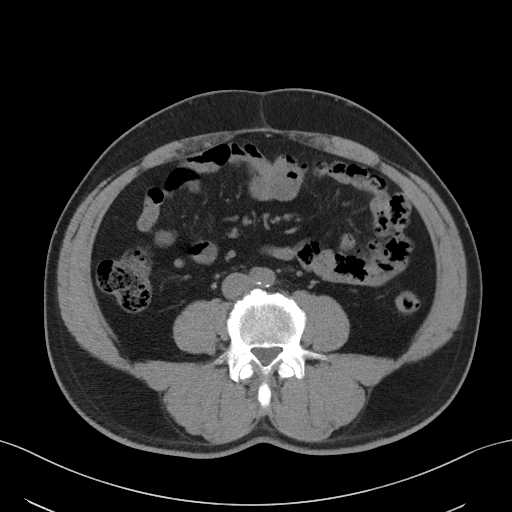
[im 65/102  soft-tissue]
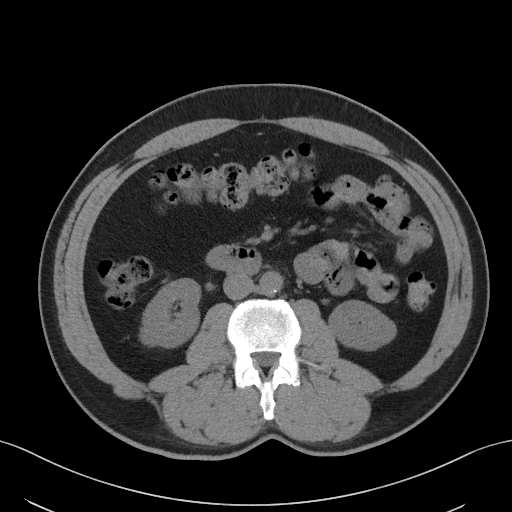
[im 65/102  bone]
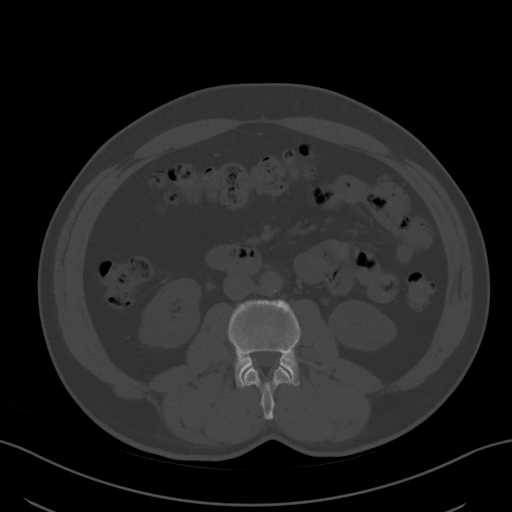
[im 73/102  soft-tissue]
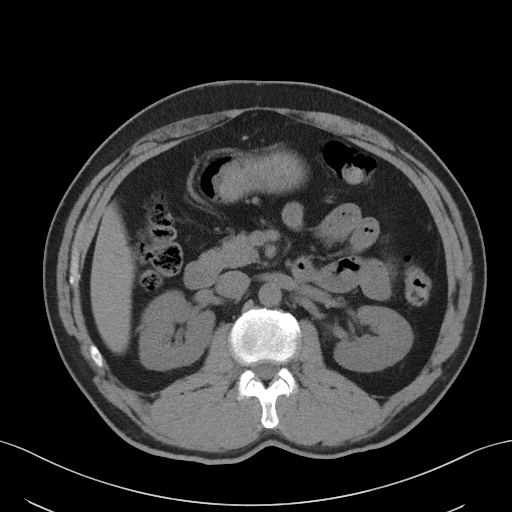
[im 81/102  soft-tissue]
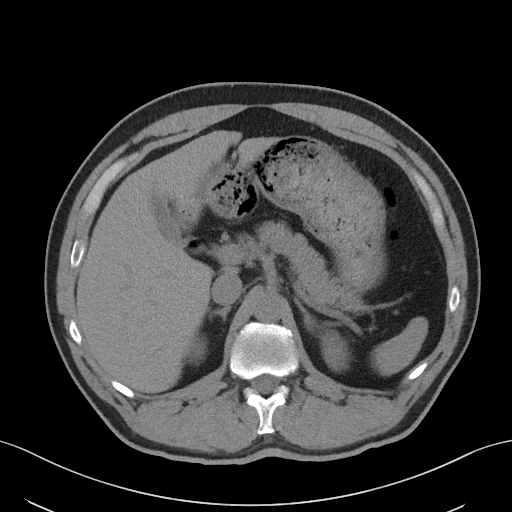
[im 89/102  soft-tissue]
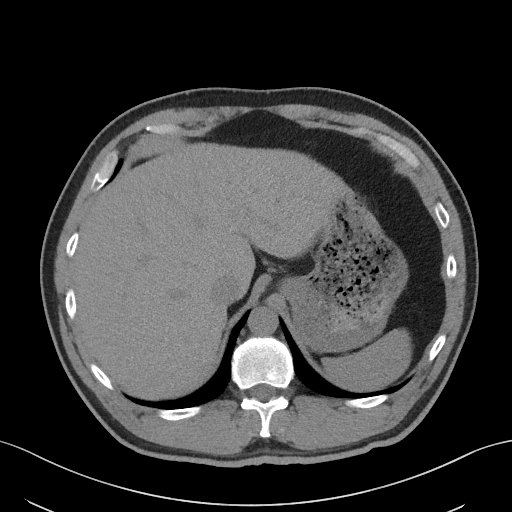
[im 97/102  soft-tissue]
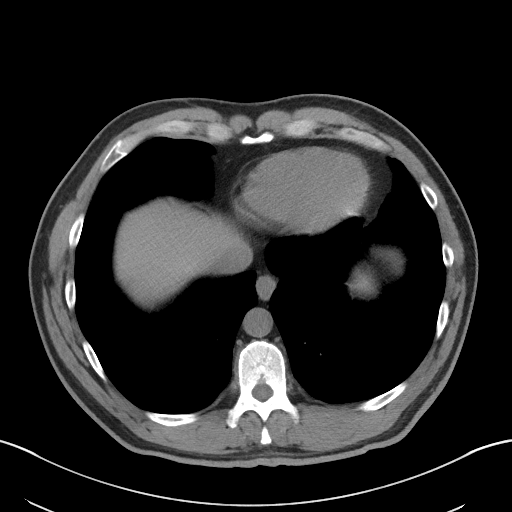

[Series 5: coronal st · coronal · 0.72mm/px · 3 of 94 slices shown]
[im 32/94  soft-tissue]
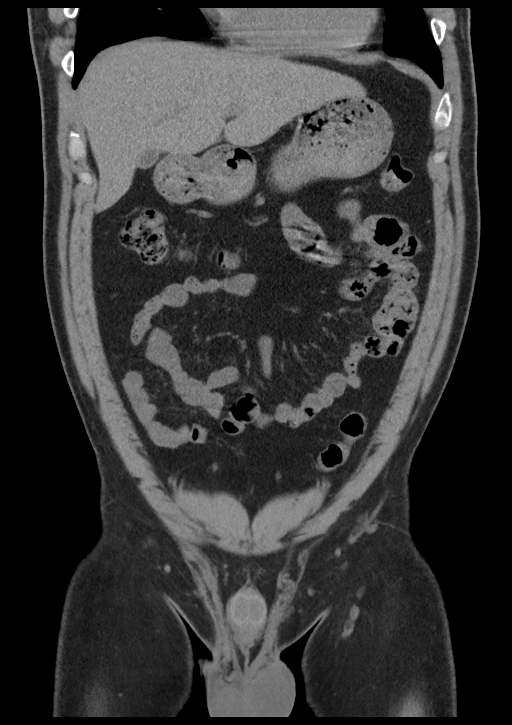
[im 42/94  soft-tissue]
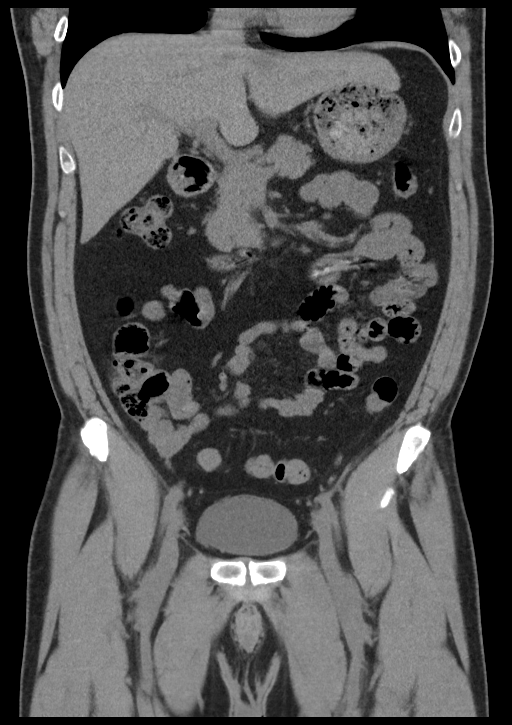
[im 52/94  soft-tissue]
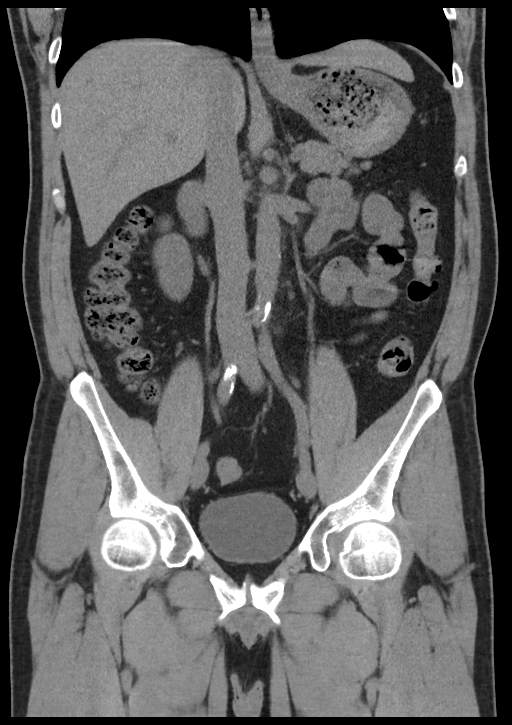

[16 of 46 positions shown; findings below may reference images not displayed]

FINDINGS: Lower chest: No acute abnormality.

Hepatobiliary: No focal liver abnormality is seen. No gallstones,
gallbladder wall thickening, or biliary dilatation.

Pancreas: Unremarkable. No pancreatic ductal dilatation or
surrounding inflammatory changes.

Spleen: Normal in size without focal abnormality.

Adrenals/Urinary Tract: The adrenal glands are normal. The right
kidney is normal. There is a 1 mm nonobstructing stone in lower pole
left kidney. There is no hydronephrosis bilaterally. The bladder is
normal.

Stomach/Bowel: Stomach is within normal limits. Appendix appears
normal. No evidence of bowel wall thickening, distention, or
inflammatory changes.

Vascular/Lymphatic: Aortic atherosclerosis. No enlarged abdominal or
pelvic lymph nodes.

Reproductive: Prostate is unremarkable.

Other: Small bilateral inguinal herniation of mesenteric fat is
identified.

Musculoskeletal: Minimal degenerative joint changes of the spine are
noted.
IMPRESSION: 1 mm nonobstructing stone in lower pole left kidney. No
hydronephrosis is identified bilaterally. The bladder is normal.

No bowel obstruction.  Normal appendix.

## 2018-12-18 ENCOUNTER — Other Ambulatory Visit (HOSPITAL_COMMUNITY): Payer: Self-pay | Admitting: Psychiatry

## 2018-12-26 ENCOUNTER — Ambulatory Visit (HOSPITAL_COMMUNITY): Payer: Medicaid Other | Admitting: Psychiatry

## 2019-01-01 ENCOUNTER — Ambulatory Visit (HOSPITAL_COMMUNITY): Payer: Medicaid Other | Admitting: Psychiatry

## 2019-01-01 ENCOUNTER — Other Ambulatory Visit: Payer: Self-pay

## 2019-01-01 ENCOUNTER — Telehealth (HOSPITAL_COMMUNITY): Payer: Self-pay | Admitting: Psychiatry

## 2019-01-14 ENCOUNTER — Other Ambulatory Visit (HOSPITAL_COMMUNITY): Payer: Self-pay | Admitting: Psychiatry

## 2019-01-14 NOTE — Telephone Encounter (Signed)
Please set up f/u

## 2019-01-14 NOTE — Telephone Encounter (Signed)
APPT SCHEDULED 6/25

## 2019-01-17 ENCOUNTER — Other Ambulatory Visit: Payer: Self-pay

## 2019-01-17 ENCOUNTER — Ambulatory Visit (HOSPITAL_COMMUNITY): Payer: Medicaid Other | Admitting: Psychiatry

## 2019-02-18 ENCOUNTER — Other Ambulatory Visit (HOSPITAL_COMMUNITY): Payer: Self-pay | Admitting: Psychiatry

## 2019-02-20 ENCOUNTER — Encounter (HOSPITAL_COMMUNITY): Payer: Self-pay | Admitting: Psychiatry

## 2019-02-20 ENCOUNTER — Ambulatory Visit (INDEPENDENT_AMBULATORY_CARE_PROVIDER_SITE_OTHER): Payer: Medicaid Other | Admitting: Psychiatry

## 2019-02-20 DIAGNOSIS — F411 Generalized anxiety disorder: Secondary | ICD-10-CM

## 2019-02-20 DIAGNOSIS — F4310 Post-traumatic stress disorder, unspecified: Secondary | ICD-10-CM

## 2019-02-20 MED ORDER — LAMOTRIGINE 100 MG PO TABS: 100.0000 mg | ORAL_TABLET | Freq: Two times a day (BID) | ORAL | 2 refills | Status: DC

## 2019-02-20 MED ORDER — ALPRAZOLAM 1 MG PO TABS: ORAL_TABLET | ORAL | 2 refills | Status: DC

## 2019-02-20 MED ORDER — FLUOXETINE HCL 40 MG PO CAPS: 40.0000 mg | ORAL_CAPSULE | Freq: Every day | ORAL | 2 refills | Status: DC
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
## 2019-05-23 ENCOUNTER — Ambulatory Visit (HOSPITAL_COMMUNITY): Payer: Medicaid Other | Admitting: Psychiatry

## 2019-06-07 ENCOUNTER — Other Ambulatory Visit (HOSPITAL_COMMUNITY): Payer: Self-pay | Admitting: Psychiatry

## 2019-06-07 ENCOUNTER — Telehealth (HOSPITAL_COMMUNITY): Payer: Self-pay | Admitting: *Deleted

## 2019-06-07 MED ORDER — LAMOTRIGINE 100 MG PO TABS: 100.0000 mg | ORAL_TABLET | Freq: Two times a day (BID) | ORAL | 2 refills | Status: DC

## 2019-06-07 MED ORDER — ALPRAZOLAM 1 MG PO TABS: ORAL_TABLET | ORAL | 2 refills | Status: DC

## 2019-06-07 MED ORDER — FLUOXETINE HCL 40 MG PO CAPS: 40.0000 mg | ORAL_CAPSULE | Freq: Every day | ORAL | 2 refills | Status: DC

## 2019-06-07 NOTE — Telephone Encounter (Signed)
SPOKE WITH & INFORMED PER PROVIDER: Med's Sent, remind him he needs to set up follow up with stroke clinic

## 2019-06-07 NOTE — Telephone Encounter (Signed)
SPOUSE/SIGNIFICANT OTHER CALLED STATED NEXT APPT IS  07/03/2019 & PATIENT WILL BE OUT OF MED'S BEFORE THE APPOINTMENT

## 2019-06-07 NOTE — Telephone Encounter (Signed)
Sent, remind him he needs to set up follow up with stroke clinic

## 2019-06-07 NOTE — Telephone Encounter (Signed)
ADDENDUM LVM PER PROVIDER:Sent, remind him he needs to set up follow up with stroke clinic

## 2019-07-03 ENCOUNTER — Other Ambulatory Visit: Payer: Self-pay

## 2019-07-03 ENCOUNTER — Ambulatory Visit (HOSPITAL_COMMUNITY): Payer: Medicaid Other | Admitting: Psychiatry

## 2019-07-11 ENCOUNTER — Ambulatory Visit: Payer: Self-pay | Admitting: Neurology

## 2019-07-11 NOTE — Progress Notes (Deleted)
NEUROLOGY CONSULTATION NOTE  Cody Walls MRN: [id number] DOB: May 19, 1971  Referring provider: Kirstie PeriAshish Shah, MD Primary care provider: Kirstie PeriAshish Shah, MD  Reason for consult:  Brainstem ICH  HISTORY OF PRESENT ILLNESS: Cody Walls is a 48 year old right-handed white man with tobacco use, methamphetamine use, HTN, PTSD and chronic back pain who presents for brainstem ICH.  History supplemented by hospital and referring provider notes.  He was [facility]/2019 after presenting with 3 day history of diplopia, left sided weakness and numbness.  He was found to have hypertensive emergency, requiring nicardipine drip.  CT head revealed acute upper pons/lower midbrain hemorrhage.  CTA of head and neck revealed no vascular malformation and no emergent large vessel stenosis or occlusion.  MRI of brain with and without contrast performed 2 days later showed stable pontine hemorrhage with small late subacute infarct in the left medial frontal lobe white matte .  Echocardiogram showed EF 50-55% with no cardiac source of embolus.  LDL was 113.  Hgb A1c was 5.6.  UDS positive for cannabinoids.  He was subsequently started on lisinopril, amlodipine and hydralazine.  Symptoms and blood pressure improved during admission and PT/OT did not recommend ongoing therapy.  He was discharged with no restrictions.  PAST MEDICAL HISTORY: Past Medical History:  Diagnosis Date  . Anxiety   . Blood poisoning   . Bursitis   . Chronic back pain   . Kidney stones   . PTSD (post-traumatic stress disorder)   . Tendonitis   . Torn rotator cuff     PAST SURGICAL HISTORY: Past Surgical History:  Procedure Laterality Date  . KIDNEY STONE SURGERY      MEDICATIONS: Current Outpatient Medications on File Prior to Visit  Medication Sig Dispense Refill  . ALPRAZolam (XANAX) 1 MG tablet TAKE 1 TABLET 4 TIMES DAILY AS NEEDED FOR SLEEP OR ANXIETY. 120 tablet 2  . FLUoxetine (PROZAC) 40 MG capsule Take 1  capsule (40 mg total) by mouth daily. 30 capsule 2  . ibuprofen (ADVIL,MOTRIN) 600 MG tablet Take 1 tablet (600 mg total) by mouth every 8 (eight) hours as needed. 30 tablet 0  . lamoTRIgine (LAMICTAL) 100 MG tablet Take 1 tablet (100 mg total) by mouth 2 (two) times daily. 60 tablet 2  . lisinopril (PRINIVIL,ZESTRIL) 20 MG tablet Take 1 tablet (20 mg total) daily by mouth. 90 tablet 3  . meloxicam (MOBIC) 15 MG tablet Take 1 tablet (15 mg total) daily by mouth. 30 tablet 0  . omeprazole (PRILOSEC) 40 MG capsule TAKE 1 CAPSULE BY MOUTH ONCE DAILY. 30 capsule 1  . rosuvastatin (CRESTOR) 20 MG tablet Take 1 tablet (20 mg total) by mouth daily. 90 tablet 1  . Vitamin D, Ergocalciferol, (DRISDOL) 50000 units CAPS capsule Take 1 capsule (50,000 Units total) every 7 (seven) days by mouth. 12 capsule 3   No current facility-administered medications on file prior to visit.    ALLERGIES: No Known Allergies  FAMILY HISTORY: Family History  Problem Relation Age of Onset  . Heart failure Other   . Alcohol abuse Father   .  SOCIAL HISTORY: Social History   Socioeconomic History  . Marital status: Legally Separated    Spouse name: Not on file  . Number of children: Not on file  . Years of education: Not on file  . Highest education level: Not on file  Occupational History  . Occupation: unemployed  Tobacco Use  . Smoking status: Current Every Day  Smoker    Packs/day: 1.50    Years: 28.00    Pack years: 42.00    Types: Cigarettes  . Smokeless tobacco: Never Used  Substance and Sexual Activity  . Alcohol use: Yes    Alcohol/week: 3.0 standard drinks    Types: 3 Cans of beer per week    Comment: occasional  . Drug use: Yes    Types: Marijuana  . Sexual activity: Yes    Birth control/protection: None  Other Topics Concern  . Not on file  Social History Narrative  . Not on file   Social Determinants of Health   Financial Resource Strain:   . Difficulty of Paying Living Expenses:  Not on file  Food Insecurity:   . Worried About Programme researcher, broadcasting/film/video in the Last Year: Not on file  . Ran Out of Food in the Last Year: Not on file  Transportation Needs:   . Lack of Transportation (Medical): Not on file  . Lack of Transportation (Non-Medical): Not on file  Physical Activity:   . Days of Exercise per Week: Not on file  . Minutes of Exercise per Session: Not on file  Stress:   . Feeling of Stress : Not on file  Social Connections:   . Frequency of Communication with Friends and Family: Not on file  . Frequency of Social Gatherings with Friends and Family: Not on file  . Attends Religious Services: Not on file  . Active Member of Clubs or Organizations: Not on file  . Attends Banker Meetings: Not on file  . Marital Status: Not on file  Intimate Partner Violence:   . Fear of Current or Ex-Partner: Not on file  . Emotionally Abused: Not on file  . Physically Abused: Not on file  . Sexually Abused: Not on file    REVIEW OF SYSTEMS: Constitutional: No fevers, chills, or sweats, no generalized fatigue, change in appetite Eyes: No visual changes, double vision, eye pain Ear, nose and throat: No hearing loss, ear pain, nasal congestion, sore throat Cardiovascular: No chest pain, palpitations Respiratory:  No shortness of breath at rest or with exertion, wheezes GastrointestinaI: No nausea, vomiting, diarrhea, abdominal pain, fecal incontinence Genitourinary:  No dysuria, urinary retention or frequency Musculoskeletal:  No neck pain, back pain Integumentary: No rash, pruritus, skin lesions Neurological: as above Psychiatric: No depression, insomnia, anxiety Endocrine: No palpitations, fatigue, diaphoresis, mood swings, change in appetite, change in weight, increased thirst Hematologic/Lymphatic:  No purpura, petechiae. Allergic/Immunologic: no itchy/runny eyes, nasal congestion, recent allergic reactions, rashes  PHYSICAL EXAM: *** General: No acute  distress.  Patient appears well-groomed.  Head:  Normocephalic/atraumatic Eyes:  fundi examined but not visualized Neck: supple, no paraspinal tenderness, full range of motion Back: No paraspinal tenderness Heart: regular rate and rhythm Lungs: Clear to auscultation bilaterally. Vascular: No carotid bruits. Neurological Exam: Mental status: alert and oriented to person, place, and time, recent and remote memory intact, fund of knowledge intact, attention and concentration intact, speech fluent and not dysarthric, language intact. Cranial nerves: CN I: not tested CN II: pupils equal, round and reactive to light, visual fields intact CN III, IV, VI:  full range of motion, no nystagmus, no ptosis CN V: facial sensation intact CN VII: upper and lower face symmetric CN VIII: hearing intact CN IX, X: gag intact, uvula midline CN XI: sternocleidomastoid and trapezius muscles intact CN XII: tongue midline Bulk & Tone: normal, no fasciculations. Motor:  5/5 throughout *** Sensation:  Pinprick ***  temperature *** and vibration sensation intact.  ***. Deep Tendon Reflexes:  2+ throughout, *** toes downgoing.  *** Finger to nose testing:  Without dysmetria.  *** Heel to shin:  Without dysmetria.  *** Gait:  Normal station and stride.  Able to turn and tandem walk. Romberg ***.  IMPRESSION: 1.  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  Hypertension  PLAN: 1.  Blood pressure control 2.    Thank you for allowing me to take part in the care of this patient.  Metta Clines, DO  CC: ***

## 2019-07-12 ENCOUNTER — Ambulatory Visit (INDEPENDENT_AMBULATORY_CARE_PROVIDER_SITE_OTHER): Payer: Medicaid Other | Admitting: Psychiatry

## 2019-07-12 ENCOUNTER — Other Ambulatory Visit: Payer: Self-pay

## 2019-07-12 ENCOUNTER — Encounter (HOSPITAL_COMMUNITY): Payer: Self-pay | Admitting: Psychiatry

## 2019-07-12 DIAGNOSIS — F4310 Post-traumatic stress disorder, unspecified: Secondary | ICD-10-CM | POA: Diagnosis not present

## 2019-07-12 DIAGNOSIS — F411 Generalized anxiety disorder: Secondary | ICD-10-CM | POA: Diagnosis not present

## 2019-07-12 MED ORDER — FLUOXETINE HCL 40 MG PO CAPS: 40.0000 mg | ORAL_CAPSULE | Freq: Every day | ORAL | 2 refills | Status: AC

## 2019-07-12 MED ORDER — ALPRAZOLAM 1 MG PO TABS: ORAL_TABLET | ORAL | 2 refills | Status: AC

## 2019-07-12 MED ORDER — LAMOTRIGINE 100 MG PO TABS: 100.0000 mg | ORAL_TABLET | Freq: Two times a day (BID) | ORAL | 2 refills | Status: AC
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
## 2019-09-23 DEATH — deceased

## 2019-10-14 ENCOUNTER — Ambulatory Visit (HOSPITAL_COMMUNITY): Payer: Medicaid Other | Admitting: Psychiatry

## 2019-10-16 ENCOUNTER — Other Ambulatory Visit: Payer: Self-pay

## 2019-10-16 ENCOUNTER — Telehealth (HOSPITAL_COMMUNITY): Payer: Self-pay | Admitting: Psychiatry

## 2019-10-16 ENCOUNTER — Ambulatory Visit (HOSPITAL_COMMUNITY): Payer: Medicaid Other | Admitting: Psychiatry

## 2019-10-17 ENCOUNTER — Telehealth (HOSPITAL_COMMUNITY): Payer: Self-pay | Admitting: Psychiatry

## 2019-10-17 NOTE — Telephone Encounter (Signed)
Dr. Tenny Craw family called and advised patient has passed away.

## 2019-10-17 NOTE — Telephone Encounter (Signed)
Dr. Tenny Craw, family advised pt passed away.

## 2020-01-07 NOTE — Telephone Encounter (Signed)
error
